# Patient Record
Sex: Male | Born: 1950
Health system: Southern US, Community
[De-identification: ages and names within clinical notes are randomized; demographics above are authoritative.]

## PROBLEM LIST (undated history)

## (undated) DIAGNOSIS — M25569 Pain in unspecified knee: Secondary | ICD-10-CM

## (undated) DIAGNOSIS — I10 Essential (primary) hypertension: Secondary | ICD-10-CM

## (undated) DIAGNOSIS — F329 Major depressive disorder, single episode, unspecified: Secondary | ICD-10-CM

## (undated) HISTORY — DX: Essential (primary) hypertension: I10

## (undated) HISTORY — DX: Pain in unspecified knee: M25.569

## (undated) HISTORY — DX: Major depressive disorder, single episode, unspecified: F32.9

## (undated) HISTORY — PX: HERNIA REPAIR: SHX51

---

## 1999-04-26 HISTORY — PX: COLONOSCOPY: SHX174

## 2000-04-25 HISTORY — PX: LASIK: SHX215

## 2000-04-25 HISTORY — PX: KIDNEY STONE SURGERY: SHX686

## 2001-07-30 ENCOUNTER — Inpatient Hospital Stay (HOSPITAL_COMMUNITY): Admission: AD | Admit: 2001-07-30 | Discharge: 2001-08-02 | Payer: Self-pay | Admitting: Cardiology

## 2005-04-25 HISTORY — PX: LITHOTRIPSY: SUR834

## 2007-04-26 HISTORY — PX: COLONOSCOPY: SHX174

## 2011-04-05 ENCOUNTER — Telehealth: Payer: Self-pay | Admitting: Internal Medicine

## 2012-04-25 HISTORY — PX: COLONOSCOPY: SHX5424

## 2012-07-26 DIAGNOSIS — M25569 Pain in unspecified knee: Secondary | ICD-10-CM | POA: Insufficient documentation

## 2012-07-26 HISTORY — DX: Pain in unspecified knee: M25.569

## 2015-04-29 DIAGNOSIS — J00 Acute nasopharyngitis [common cold]: Secondary | ICD-10-CM | POA: Diagnosis not present

## 2015-05-22 DIAGNOSIS — J40 Bronchitis, not specified as acute or chronic: Secondary | ICD-10-CM | POA: Diagnosis not present

## 2015-05-22 DIAGNOSIS — Z9181 History of falling: Secondary | ICD-10-CM | POA: Diagnosis not present

## 2015-09-08 DIAGNOSIS — J301 Allergic rhinitis due to pollen: Secondary | ICD-10-CM | POA: Diagnosis not present

## 2015-09-08 DIAGNOSIS — Z79899 Other long term (current) drug therapy: Secondary | ICD-10-CM | POA: Diagnosis not present

## 2015-09-08 DIAGNOSIS — I10 Essential (primary) hypertension: Secondary | ICD-10-CM | POA: Diagnosis not present

## 2015-09-08 DIAGNOSIS — Z8659 Personal history of other mental and behavioral disorders: Secondary | ICD-10-CM | POA: Diagnosis not present

## 2015-09-08 DIAGNOSIS — E1165 Type 2 diabetes mellitus with hyperglycemia: Secondary | ICD-10-CM | POA: Diagnosis not present

## 2015-09-08 DIAGNOSIS — Z125 Encounter for screening for malignant neoplasm of prostate: Secondary | ICD-10-CM | POA: Diagnosis not present

## 2015-09-08 DIAGNOSIS — I77810 Thoracic aortic ectasia: Secondary | ICD-10-CM | POA: Diagnosis not present

## 2015-09-08 DIAGNOSIS — Z23 Encounter for immunization: Secondary | ICD-10-CM | POA: Diagnosis not present

## 2015-09-08 DIAGNOSIS — Z1211 Encounter for screening for malignant neoplasm of colon: Secondary | ICD-10-CM | POA: Diagnosis not present

## 2015-09-08 DIAGNOSIS — Z8679 Personal history of other diseases of the circulatory system: Secondary | ICD-10-CM | POA: Diagnosis not present

## 2015-09-08 DIAGNOSIS — E782 Mixed hyperlipidemia: Secondary | ICD-10-CM | POA: Diagnosis not present

## 2015-09-14 DIAGNOSIS — Z1211 Encounter for screening for malignant neoplasm of colon: Secondary | ICD-10-CM | POA: Diagnosis not present

## 2015-10-26 DIAGNOSIS — H2513 Age-related nuclear cataract, bilateral: Secondary | ICD-10-CM | POA: Diagnosis not present

## 2015-12-22 DIAGNOSIS — L578 Other skin changes due to chronic exposure to nonionizing radiation: Secondary | ICD-10-CM | POA: Diagnosis not present

## 2015-12-22 DIAGNOSIS — L301 Dyshidrosis [pompholyx]: Secondary | ICD-10-CM | POA: Diagnosis not present

## 2016-07-19 DIAGNOSIS — B351 Tinea unguium: Secondary | ICD-10-CM | POA: Diagnosis not present

## 2016-07-19 DIAGNOSIS — D2239 Melanocytic nevi of other parts of face: Secondary | ICD-10-CM | POA: Diagnosis not present

## 2016-07-19 DIAGNOSIS — L570 Actinic keratosis: Secondary | ICD-10-CM | POA: Diagnosis not present

## 2016-09-26 DIAGNOSIS — Z136 Encounter for screening for cardiovascular disorders: Secondary | ICD-10-CM | POA: Diagnosis not present

## 2016-09-26 DIAGNOSIS — Z131 Encounter for screening for diabetes mellitus: Secondary | ICD-10-CM | POA: Diagnosis not present

## 2016-09-26 DIAGNOSIS — D519 Vitamin B12 deficiency anemia, unspecified: Secondary | ICD-10-CM | POA: Diagnosis not present

## 2016-09-26 DIAGNOSIS — Z0000 Encounter for general adult medical examination without abnormal findings: Secondary | ICD-10-CM | POA: Diagnosis not present

## 2016-09-26 DIAGNOSIS — Z79899 Other long term (current) drug therapy: Secondary | ICD-10-CM | POA: Diagnosis not present

## 2016-09-26 DIAGNOSIS — Z23 Encounter for immunization: Secondary | ICD-10-CM | POA: Diagnosis not present

## 2016-09-26 DIAGNOSIS — Z125 Encounter for screening for malignant neoplasm of prostate: Secondary | ICD-10-CM | POA: Diagnosis not present

## 2016-09-26 DIAGNOSIS — Z6831 Body mass index (BMI) 31.0-31.9, adult: Secondary | ICD-10-CM | POA: Diagnosis not present

## 2016-09-26 DIAGNOSIS — E782 Mixed hyperlipidemia: Secondary | ICD-10-CM | POA: Diagnosis not present

## 2016-09-26 DIAGNOSIS — Z9181 History of falling: Secondary | ICD-10-CM | POA: Diagnosis not present

## 2016-09-26 DIAGNOSIS — Z1389 Encounter for screening for other disorder: Secondary | ICD-10-CM | POA: Diagnosis not present

## 2016-10-04 DIAGNOSIS — I77811 Abdominal aortic ectasia: Secondary | ICD-10-CM | POA: Diagnosis not present

## 2016-10-04 DIAGNOSIS — Z136 Encounter for screening for cardiovascular disorders: Secondary | ICD-10-CM | POA: Diagnosis not present

## 2016-10-04 DIAGNOSIS — Z87891 Personal history of nicotine dependence: Secondary | ICD-10-CM | POA: Diagnosis not present

## 2016-11-21 DIAGNOSIS — H25813 Combined forms of age-related cataract, bilateral: Secondary | ICD-10-CM | POA: Diagnosis not present

## 2016-12-08 DIAGNOSIS — B351 Tinea unguium: Secondary | ICD-10-CM | POA: Diagnosis not present

## 2016-12-08 DIAGNOSIS — L304 Erythema intertrigo: Secondary | ICD-10-CM | POA: Diagnosis not present

## 2017-02-21 DIAGNOSIS — I10 Essential (primary) hypertension: Secondary | ICD-10-CM | POA: Diagnosis not present

## 2017-02-21 DIAGNOSIS — K420 Umbilical hernia with obstruction, without gangrene: Secondary | ICD-10-CM | POA: Diagnosis not present

## 2017-03-20 DIAGNOSIS — R7309 Other abnormal glucose: Secondary | ICD-10-CM | POA: Diagnosis not present

## 2017-03-20 DIAGNOSIS — Z79899 Other long term (current) drug therapy: Secondary | ICD-10-CM | POA: Diagnosis not present

## 2017-03-20 DIAGNOSIS — E782 Mixed hyperlipidemia: Secondary | ICD-10-CM | POA: Diagnosis not present

## 2017-03-28 DIAGNOSIS — Z1339 Encounter for screening examination for other mental health and behavioral disorders: Secondary | ICD-10-CM | POA: Diagnosis not present

## 2017-03-28 DIAGNOSIS — E7439 Other disorders of intestinal carbohydrate absorption: Secondary | ICD-10-CM | POA: Diagnosis not present

## 2017-03-28 DIAGNOSIS — I10 Essential (primary) hypertension: Secondary | ICD-10-CM | POA: Diagnosis not present

## 2017-03-28 DIAGNOSIS — E782 Mixed hyperlipidemia: Secondary | ICD-10-CM | POA: Diagnosis not present

## 2017-03-28 DIAGNOSIS — Z6829 Body mass index (BMI) 29.0-29.9, adult: Secondary | ICD-10-CM | POA: Diagnosis not present

## 2017-04-25 HISTORY — PX: HERNIA REPAIR: SHX51

## 2017-04-25 HISTORY — PX: COLONOSCOPY: SHX174

## 2017-05-09 DIAGNOSIS — K420 Umbilical hernia with obstruction, without gangrene: Secondary | ICD-10-CM | POA: Diagnosis not present

## 2017-05-09 DIAGNOSIS — I10 Essential (primary) hypertension: Secondary | ICD-10-CM | POA: Diagnosis not present

## 2017-05-18 DIAGNOSIS — I10 Essential (primary) hypertension: Secondary | ICD-10-CM | POA: Diagnosis not present

## 2017-05-18 DIAGNOSIS — K420 Umbilical hernia with obstruction, without gangrene: Secondary | ICD-10-CM | POA: Diagnosis not present

## 2017-05-18 DIAGNOSIS — Z01818 Encounter for other preprocedural examination: Secondary | ICD-10-CM | POA: Diagnosis not present

## 2017-05-18 DIAGNOSIS — I493 Ventricular premature depolarization: Secondary | ICD-10-CM | POA: Diagnosis not present

## 2017-05-18 DIAGNOSIS — I440 Atrioventricular block, first degree: Secondary | ICD-10-CM | POA: Diagnosis not present

## 2017-05-22 DIAGNOSIS — I10 Essential (primary) hypertension: Secondary | ICD-10-CM | POA: Diagnosis not present

## 2017-05-22 DIAGNOSIS — Z7982 Long term (current) use of aspirin: Secondary | ICD-10-CM | POA: Diagnosis not present

## 2017-05-22 DIAGNOSIS — Z87891 Personal history of nicotine dependence: Secondary | ICD-10-CM | POA: Diagnosis not present

## 2017-05-22 DIAGNOSIS — Z79899 Other long term (current) drug therapy: Secondary | ICD-10-CM | POA: Diagnosis not present

## 2017-05-22 DIAGNOSIS — K420 Umbilical hernia with obstruction, without gangrene: Secondary | ICD-10-CM | POA: Diagnosis not present

## 2017-05-22 DIAGNOSIS — I4891 Unspecified atrial fibrillation: Secondary | ICD-10-CM | POA: Diagnosis not present

## 2017-10-10 DIAGNOSIS — B351 Tinea unguium: Secondary | ICD-10-CM | POA: Diagnosis not present

## 2017-10-10 DIAGNOSIS — L578 Other skin changes due to chronic exposure to nonionizing radiation: Secondary | ICD-10-CM | POA: Diagnosis not present

## 2017-10-10 DIAGNOSIS — L821 Other seborrheic keratosis: Secondary | ICD-10-CM | POA: Diagnosis not present

## 2017-10-10 DIAGNOSIS — L570 Actinic keratosis: Secondary | ICD-10-CM | POA: Diagnosis not present

## 2017-10-13 DIAGNOSIS — E7439 Other disorders of intestinal carbohydrate absorption: Secondary | ICD-10-CM | POA: Diagnosis not present

## 2017-10-13 DIAGNOSIS — D510 Vitamin B12 deficiency anemia due to intrinsic factor deficiency: Secondary | ICD-10-CM | POA: Diagnosis not present

## 2017-10-13 DIAGNOSIS — Z79899 Other long term (current) drug therapy: Secondary | ICD-10-CM | POA: Diagnosis not present

## 2017-10-13 DIAGNOSIS — Z8679 Personal history of other diseases of the circulatory system: Secondary | ICD-10-CM | POA: Diagnosis not present

## 2017-10-13 DIAGNOSIS — I77810 Thoracic aortic ectasia: Secondary | ICD-10-CM | POA: Diagnosis not present

## 2017-10-13 DIAGNOSIS — E782 Mixed hyperlipidemia: Secondary | ICD-10-CM | POA: Diagnosis not present

## 2017-10-13 DIAGNOSIS — Z6830 Body mass index (BMI) 30.0-30.9, adult: Secondary | ICD-10-CM | POA: Diagnosis not present

## 2017-10-13 DIAGNOSIS — I10 Essential (primary) hypertension: Secondary | ICD-10-CM | POA: Diagnosis not present

## 2017-10-13 DIAGNOSIS — Z125 Encounter for screening for malignant neoplasm of prostate: Secondary | ICD-10-CM | POA: Diagnosis not present

## 2017-10-13 DIAGNOSIS — Z0000 Encounter for general adult medical examination without abnormal findings: Secondary | ICD-10-CM | POA: Diagnosis not present

## 2017-10-19 DIAGNOSIS — K5730 Diverticulosis of large intestine without perforation or abscess without bleeding: Secondary | ICD-10-CM | POA: Diagnosis not present

## 2017-10-19 DIAGNOSIS — K648 Other hemorrhoids: Secondary | ICD-10-CM | POA: Diagnosis not present

## 2017-10-31 DIAGNOSIS — B309 Viral conjunctivitis, unspecified: Secondary | ICD-10-CM | POA: Diagnosis not present

## 2017-11-07 DIAGNOSIS — K648 Other hemorrhoids: Secondary | ICD-10-CM | POA: Diagnosis not present

## 2017-11-07 DIAGNOSIS — F329 Major depressive disorder, single episode, unspecified: Secondary | ICD-10-CM | POA: Diagnosis not present

## 2017-11-07 DIAGNOSIS — E7800 Pure hypercholesterolemia, unspecified: Secondary | ICD-10-CM | POA: Diagnosis not present

## 2017-11-07 DIAGNOSIS — Z7982 Long term (current) use of aspirin: Secondary | ICD-10-CM | POA: Diagnosis not present

## 2017-11-07 DIAGNOSIS — K5730 Diverticulosis of large intestine without perforation or abscess without bleeding: Secondary | ICD-10-CM | POA: Diagnosis not present

## 2017-11-07 DIAGNOSIS — Z79899 Other long term (current) drug therapy: Secondary | ICD-10-CM | POA: Diagnosis not present

## 2017-11-07 DIAGNOSIS — K644 Residual hemorrhoidal skin tags: Secondary | ICD-10-CM | POA: Diagnosis not present

## 2017-11-07 DIAGNOSIS — Z09 Encounter for follow-up examination after completed treatment for conditions other than malignant neoplasm: Secondary | ICD-10-CM | POA: Diagnosis not present

## 2017-11-07 DIAGNOSIS — I10 Essential (primary) hypertension: Secondary | ICD-10-CM | POA: Diagnosis not present

## 2017-11-07 DIAGNOSIS — Z86010 Personal history of colonic polyps: Secondary | ICD-10-CM | POA: Diagnosis not present

## 2017-11-07 DIAGNOSIS — Z1211 Encounter for screening for malignant neoplasm of colon: Secondary | ICD-10-CM | POA: Diagnosis not present

## 2017-11-07 DIAGNOSIS — Z87891 Personal history of nicotine dependence: Secondary | ICD-10-CM | POA: Diagnosis not present

## 2017-11-21 ENCOUNTER — Encounter: Payer: Self-pay | Admitting: Cardiology

## 2017-11-21 ENCOUNTER — Encounter (INDEPENDENT_AMBULATORY_CARE_PROVIDER_SITE_OTHER): Payer: Self-pay

## 2017-11-21 ENCOUNTER — Ambulatory Visit (INDEPENDENT_AMBULATORY_CARE_PROVIDER_SITE_OTHER): Payer: PPO | Admitting: Cardiology

## 2017-11-21 VITALS — BP 132/68 | HR 64 | Ht 74.0 in | Wt 235.0 lb

## 2017-11-21 DIAGNOSIS — I471 Supraventricular tachycardia, unspecified: Secondary | ICD-10-CM

## 2017-11-21 DIAGNOSIS — E785 Hyperlipidemia, unspecified: Secondary | ICD-10-CM

## 2017-11-21 DIAGNOSIS — F32A Depression, unspecified: Secondary | ICD-10-CM

## 2017-11-21 DIAGNOSIS — F329 Major depressive disorder, single episode, unspecified: Secondary | ICD-10-CM | POA: Insufficient documentation

## 2017-11-21 DIAGNOSIS — I10 Essential (primary) hypertension: Secondary | ICD-10-CM | POA: Diagnosis not present

## 2017-11-21 HISTORY — DX: Supraventricular tachycardia: I47.1

## 2017-11-21 HISTORY — DX: Hyperlipidemia, unspecified: E78.5

## 2017-11-21 HISTORY — DX: Essential (primary) hypertension: I10

## 2017-11-21 HISTORY — DX: Depression, unspecified: F32.A

## 2017-11-21 HISTORY — DX: Supraventricular tachycardia, unspecified: I47.10

## 2017-11-21 NOTE — Patient Instructions (Signed)
Medication Instructions:  Your physician recommends that you continue on your current medications as directed. Please refer to the Current Medication list given to you today.  Labwork: None  Testing/Procedures: Your physician has requested that you have an echocardiogram. Echocardiography is a painless test that uses sound waves to create images of your heart. It provides your doctor with information about the size and shape of your heart and how well your heart's chambers and valves are working. This procedure takes approximately one hour. There are no restrictions for this procedure.  Your physician has recommended that you wear a holter monitor. Holter monitors are medical devices that record the heart's electrical activity. Doctors most often use these monitors to diagnose arrhythmias. Arrhythmias are problems with the speed or rhythm of the heartbeat. The monitor is a small, portable device. You can wear one while you do your normal daily activities. This is usually used to diagnose what is causing palpitations/syncope (passing out).  Follow-Up: Your physician recommends that you schedule a follow-up appointment in: 1 month  Any Other Special Instructions Will Be Listed Below (If Applicable).     If you need a refill on your cardiac medications before your next appointment, please call your pharmacy.   [place], RN, BSN

## 2017-11-21 NOTE — Progress Notes (Signed)
Cardiology Consultation:    Date:  11/21/2017   ID:  DENNY MCCREE, DOB [birth year], MRN [id number]  PCP:  Angelina Sheriff, MD  Cardiologist:  Jenne Campus, MD   Referring MD: Angelina Sheriff, MD   No chief complaint on file. I would like to be established as a patient  History of Present Illness:    Austin Drake is a 67 y.o. male who is being seen today for the evaluation of history of supraventricular tachycardia ablation in 2003 he had some palpitations in the up going to the point that at the request of [place], John F. II, MD.  And eventually he was sent to Dr. Dorothea Ogle and he ended up having an ablation done I suspect this is supraventricular tachycardia ablation however I do not have documentation of it.  He did have palpitations before that but since the time of ablation there is no trouble he can exercise he try to exercise on the regular basis he does it 3 times a week denies having any chest pain tightness squeezing pressure branches but described to have shortness of breath.  There is no palpitations no dizziness no passing out  Past Medical History:  Diagnosis Date  . Depression 11/21/2017  . Hypertension 11/21/2017  . Knee joint pain 07/26/2012    Past Surgical History:  Procedure Laterality Date  . COLONOSCOPY  2001  . COLONOSCOPY  2009  . COLONOSCOPY  2014  . COLONOSCOPY  2019  . HERNIA REPAIR    . HERNIA REPAIR  2019  . KIDNEY STONE SURGERY  2002  . LASIK  2002  . LITHOTRIPSY  2007    Current Medications: Current Meds  Medication Sig  . aspirin EC 81 MG tablet Take 1 tablet by mouth daily.  . benazepril (LOTENSIN) 5 MG tablet Take 1 tablet by mouth daily.  Marland Kitchen buPROPion (WELLBUTRIN SR) 150 MG 12 hr tablet Take 1 tablet by mouth daily.  . Cyanocobalamin (B-12 TR) 1000 MCG TBCR Take 1 tablet by mouth 2 (two) times daily.      Allergies:   Patient has no known allergies.   Social History   Socioeconomic History  . Marital status: Married      Spouse name: Not on file  . Number of children: Not on file  . Years of education: Not on file  . Highest education level: Not on file  Occupational History  . Not on file  Social Needs  . Financial resource strain: Not on file  . Food insecurity:    Worry: Not on file    Inability: Not on file  . Transportation needs:    Medical: Not on file    Non-medical: Not on file  Tobacco Use  . Smoking status: Former Research scientist (life sciences)  . Smokeless tobacco: Never Used  Substance and Sexual Activity  . Alcohol use: Not on file  . Drug use: Not on file  . Sexual activity: Not on file  Lifestyle  . Physical activity:    Days per week: Not on file    Minutes per session: Not on file  . Stress: Not on file  Relationships  . Social connections:    Talks on phone: Not on file    Gets together: Not on file    Attends religious service: Not on file    Active member of club or organization: Not on file    Attends meetings of clubs or organizations: Not on file    Relationship  status: Not on file  Other Topics Concern  . Not on file  Social History Narrative  . Not on file     Family History: The patient's family history includes Diabetes in his father; Heart disease in his father. ROS:   Please see the history of present illness.    All 14 point review of systems negative except as described per history of present illness.  EKGs/Labs/Other Studies Reviewed:    The following studies were reviewed today:   EKG:  EKG is  ordered today.  The ekg ordered today demonstrates normal sinus rhythm first-degree AV block normal QS complex duration morphology left axis no ST-T segment changes  Recent Labs: No results found for requested labs within last 8760 hours.  Recent Lipid Panel No results found for: CHOL, TRIG, HDL, CHOLHDL, VLDL, LDLCALC, LDLDIRECT  Physical Exam:    VS:  BP 132/68 (BP Location: Right Arm, Patient Position: Sitting, Cuff Size: Normal)   Pulse 64   Ht 6\' 2"  (1.88 m)    Wt 235 lb (106.6 kg)   SpO2 98%   BMI 30.17 kg/m     Wt Readings from Last 3 Encounters:  11/21/17 235 lb (106.6 kg)     GEN:  Well nourished, well developed in no acute distress HEENT: Normal NECK: No JVD; No carotid bruits LYMPHATICS: No lymphadenopathy CARDIAC: RRR, no murmurs, no rubs, no gallops RESPIRATORY:  Clear to auscultation without rales, wheezing or rhonchi  ABDOMEN: Soft, non-tender, non-distended MUSCULOSKELETAL:  No edema; No deformity  SKIN: Warm and dry NEUROLOGIC:  Alert and oriented x 3 PSYCHIATRIC:  Normal affect   ASSESSMENT:    1. Essential hypertension   2. SVT (supraventricular tachycardia) ([place])   3. Dyslipidemia    PLAN:    In order of problems listed above:  1. History of supraventricular tachycardia ablation we will try to get record to clarify exactly what was done.  He denies having any palpitations however he does have first-degree AV block on EKG therefore I will schedule him to have 48 hours Holter monitor.  As a part of evaluation we will get echocardiogram. 2. Essential hypertension blood pressure appears to be well controlled continue present management. 3. Dyslipidemia he is taking fluvastatin which I will continue we will call primary care physician to get his fasting lipid profile.  See him back in my office in about 1 month or sooner if he get a problem   Medication Adjustments/Labs and Tests Ordered: Current medicines are reviewed at length with the patient today.  Concerns regarding medicines are outlined above.  No orders of the defined types were placed in this encounter.  No orders of the defined types were placed in this encounter.   Signed, Park Liter, MD, [facility]. 11/21/2017 3:14 PM    [facility]

## 2017-11-27 DIAGNOSIS — H25813 Combined forms of age-related cataract, bilateral: Secondary | ICD-10-CM | POA: Diagnosis not present

## 2017-12-04 ENCOUNTER — Ambulatory Visit (INDEPENDENT_AMBULATORY_CARE_PROVIDER_SITE_OTHER): Payer: PPO

## 2017-12-04 DIAGNOSIS — I471 Supraventricular tachycardia: Secondary | ICD-10-CM | POA: Diagnosis not present

## 2017-12-18 ENCOUNTER — Ambulatory Visit: Payer: PPO | Admitting: Cardiology

## 2017-12-28 ENCOUNTER — Ambulatory Visit (INDEPENDENT_AMBULATORY_CARE_PROVIDER_SITE_OTHER): Payer: PPO

## 2017-12-28 ENCOUNTER — Other Ambulatory Visit: Payer: Self-pay

## 2017-12-28 DIAGNOSIS — I471 Supraventricular tachycardia: Secondary | ICD-10-CM

## 2017-12-28 NOTE — Progress Notes (Signed)
Complete echocardiogram has been performed.  Jimmy Georgio Hattabaugh RDCS 

## 2018-01-03 ENCOUNTER — Encounter: Payer: Self-pay | Admitting: Cardiology

## 2018-01-03 ENCOUNTER — Ambulatory Visit (INDEPENDENT_AMBULATORY_CARE_PROVIDER_SITE_OTHER): Payer: PPO | Admitting: Cardiology

## 2018-01-03 VITALS — BP 120/64 | HR 68 | Ht 74.0 in | Wt 233.2 lb

## 2018-01-03 DIAGNOSIS — I10 Essential (primary) hypertension: Secondary | ICD-10-CM | POA: Diagnosis not present

## 2018-01-03 DIAGNOSIS — E785 Hyperlipidemia, unspecified: Secondary | ICD-10-CM | POA: Diagnosis not present

## 2018-01-03 DIAGNOSIS — I471 Supraventricular tachycardia: Secondary | ICD-10-CM | POA: Diagnosis not present

## 2018-01-03 DIAGNOSIS — I4729 Other ventricular tachycardia: Secondary | ICD-10-CM | POA: Insufficient documentation

## 2018-01-03 DIAGNOSIS — I472 Ventricular tachycardia: Secondary | ICD-10-CM | POA: Diagnosis not present

## 2018-01-03 HISTORY — DX: Ventricular tachycardia: I47.2

## 2018-01-03 HISTORY — DX: Other ventricular tachycardia: I47.29

## 2018-01-03 NOTE — Patient Instructions (Signed)

## 2018-01-03 NOTE — Progress Notes (Signed)
Cardiology Office Note:    Date:  01/03/2018   ID:  Austin Drake, DOB 1950/06/18, MRN [id number]  PCP:  Angelina Sheriff, MD  Cardiologist:  Jenne Campus, MD    Referring MD: Angelina Sheriff, MD   Chief Complaint  Patient presents with  . 1 month follow up  Doing well no chest pain tightness squeezing pressure burning chest no palpitations no dizziness no passing out.  History of Present Illness:    Austin Drake is a 67 y.o. male with history of supraventricular tachycardia ablation comes today to my office for follow-up doing well no chest pain tightness squeezing pressure burning chest.  He did have echocardiogram which showed preserved left ventricular ejection fraction.  He also wore Holter monitor for 48 hours which showed a lot of supraventricular as well as ventricle ectopy surprisingly there were episodes of nonsustained ventricular tachycardia which is completely asymptomatic.  Since he is doing well from that point review we will continue conservative  Past Medical History:  Diagnosis Date  . Depression 11/21/2017  . Hypertension 11/21/2017  . Knee joint pain 07/26/2012    Past Surgical History:  Procedure Laterality Date  . COLONOSCOPY  2001  . COLONOSCOPY  2009  . COLONOSCOPY  2014  . COLONOSCOPY  2019  . HERNIA REPAIR    . HERNIA REPAIR  2019  . KIDNEY STONE SURGERY  2002  . LASIK  2002  . LITHOTRIPSY  2007    Current Medications: Current Meds  Medication Sig  . aspirin EC 81 MG tablet Take 1 tablet by mouth daily.  . benazepril (LOTENSIN) 5 MG tablet Take 1 tablet by mouth daily.  Marland Kitchen buPROPion (WELLBUTRIN SR) 150 MG 12 hr tablet Take 1 tablet by mouth daily.  . Cyanocobalamin (B-12 TR) 1000 MCG TBCR Take 1 tablet by mouth 2 (two) times daily.   . fluvastatin XL (LESCOL XL) 80 MG 24 hr tablet Take 80 mg by mouth daily.     Allergies:   Patient has no known allergies.   Social History   Socioeconomic History  . Marital status: Married      Spouse name: Not on file  . Number of children: Not on file  . Years of education: Not on file  . Highest education level: Not on file  Occupational History  . Not on file  Social Needs  . Financial resource strain: Not on file  . Food insecurity:    Worry: Not on file    Inability: Not on file  . Transportation needs:    Medical: Not on file    Non-medical: Not on file  Tobacco Use  . Smoking status: Former Research scientist (life sciences)  . Smokeless tobacco: Never Used  Substance and Sexual Activity  . Alcohol use: Not on file  . Drug use: Not on file  . Sexual activity: Not on file  Lifestyle  . Physical activity:    Days per week: Not on file    Minutes per session: Not on file  . Stress: Not on file  Relationships  . Social connections:    Talks on phone: Not on file    Gets together: Not on file    Attends religious service: Not on file    Active member of club or organization: Not on file    Attends meetings of clubs or organizations: Not on file    Relationship status: Not on file  Other Topics Concern  . Not on file  Social  History Narrative  . Not on file     Family History: The patient's family history includes Diabetes in his father; Heart disease in his father. ROS:   Please see the history of present illness.    All 14 point review of systems negative except as described per history of present illness  EKGs/Labs/Other Studies Reviewed:      Recent Labs: No results found for requested labs within last 8760 hours.  Recent Lipid Panel No results found for: CHOL, TRIG, HDL, CHOLHDL, VLDL, LDLCALC, LDLDIRECT  Physical Exam:    VS:  BP 120/64   Pulse 68   Ht 6\' 2"  (1.88 m)   Wt 233 lb 3.2 oz (105.8 kg)   SpO2 94%   BMI 29.94 kg/m     Wt Readings from Last 3 Encounters:  01/03/18 233 lb 3.2 oz (105.8 kg)  11/21/17 235 lb (106.6 kg)     GEN:  Well nourished, well developed in no acute distress HEENT: Normal NECK: No JVD; No carotid bruits LYMPHATICS: No  lymphadenopathy CARDIAC: RRR, no murmurs, no rubs, no gallops RESPIRATORY:  Clear to auscultation without rales, wheezing or rhonchi  ABDOMEN: Soft, non-tender, non-distended MUSCULOSKELETAL:  No edema; No deformity  SKIN: Warm and dry LOWER EXTREMITIES: no swelling NEUROLOGIC:  Alert and oriented x 3 PSYCHIATRIC:  Normal affect   ASSESSMENT:    1. SVT (supraventricular tachycardia) ([place])   2. Essential hypertension   3. Dyslipidemia   4. Nonsustained ventricular tachycardia (HCC)    PLAN:    In order of problems listed above:  1. Supraventricular tachycardia stable on appropriate medications which I will continue. 2. Nonsustained ventricular tachycardia asymptomatic preserved left ventricular ejection fraction aggressively exercising with no difficulties we will continue present management. 3. Dyslipidemia continue with Lescol.  Overall he is doing well I asked him to let me know if he still having some unexplained shortness of breath dizziness or passing out.  Otherwise I see him back in 6 months   Medication Adjustments/Labs and Tests Ordered: Current medicines are reviewed at length with the patient today.  Concerns regarding medicines are outlined above.  No orders of the defined types were placed in this encounter.  Medication changes: No orders of the defined types were placed in this encounter.   Signed, Park Liter, MD, [facility] 01/03/2018 4:10 PM     [facility]

## 2018-04-03 DIAGNOSIS — L570 Actinic keratosis: Secondary | ICD-10-CM | POA: Diagnosis not present

## 2018-07-02 ENCOUNTER — Encounter: Payer: Self-pay | Admitting: Cardiology

## 2018-07-02 ENCOUNTER — Ambulatory Visit (INDEPENDENT_AMBULATORY_CARE_PROVIDER_SITE_OTHER): Payer: PPO | Admitting: Cardiology

## 2018-07-02 VITALS — BP 122/64 | HR 74 | Ht 74.0 in | Wt 217.4 lb

## 2018-07-02 DIAGNOSIS — I471 Supraventricular tachycardia, unspecified: Secondary | ICD-10-CM

## 2018-07-02 DIAGNOSIS — I10 Essential (primary) hypertension: Secondary | ICD-10-CM | POA: Diagnosis not present

## 2018-07-02 DIAGNOSIS — E785 Hyperlipidemia, unspecified: Secondary | ICD-10-CM

## 2018-07-02 DIAGNOSIS — I4729 Other ventricular tachycardia: Secondary | ICD-10-CM

## 2018-07-02 DIAGNOSIS — I472 Ventricular tachycardia: Secondary | ICD-10-CM | POA: Diagnosis not present

## 2018-07-02 NOTE — Addendum Note (Signed)
Addended by: Ashok Norris on: 07/02/2018 08:58 AM   Modules accepted: Orders

## 2018-07-02 NOTE — Progress Notes (Signed)
Cardiology Office Note:    Date:  07/02/2018   ID:  Austin Drake, DOB 07/24/1950, MRN [id number]  PCP:  Angelina Sheriff, MD  Cardiologist:  Jenne Campus, MD    Referring MD: Angelina Sheriff, MD   Chief Complaint  Patient presents with  . Follow-up  Doing great  History of Present Illness:    Austin Drake is a 68 y.o. male. Supraventricular tachycardia status post supraventricular tachycardia ablation done close to 18 years ago.  I have been following him for frequent ventricular ectopy with nonsustained ventricular tachycardia overall he is doing great he exercised on the regular basis aggressively with no difficulties I did echocardiogram in September which showed preserved normal left ventricular ejection fraction interestingly at the end of the visit today he mentioned to 1 situation that he became momentarily dizzy when he was driving a car.  He did not passed out he never had any episode of syncope he does get slight dizziness he gets up very quickly and this is the only episode that he got there is somewhat concerning for arrhythmia he did not have any palpitations when he was getting dizzy.  Past Medical History:  Diagnosis Date  . Depression 11/21/2017  . Hypertension 11/21/2017  . Knee joint pain 07/26/2012    Past Surgical History:  Procedure Laterality Date  . COLONOSCOPY  2001  . COLONOSCOPY  2009  . COLONOSCOPY  2014  . COLONOSCOPY  2019  . HERNIA REPAIR    . HERNIA REPAIR  2019  . KIDNEY STONE SURGERY  2002  . LASIK  2002  . LITHOTRIPSY  2007    Current Medications: Current Meds  Medication Sig  . aspirin EC 81 MG tablet Take 1 tablet by mouth daily.  . benazepril (LOTENSIN) 5 MG tablet Take 1 tablet by mouth daily.  Marland Kitchen buPROPion (WELLBUTRIN SR) 150 MG 12 hr tablet Take 1 tablet by mouth daily.  . Cyanocobalamin (B-12 TR) 1000 MCG TBCR Take 1 tablet by mouth 2 (two) times daily.   . fluvastatin XL (LESCOL XL) 80 MG 24 hr tablet Take 80 mg by  mouth daily.     Allergies:   Patient has no known allergies.   Social History   Socioeconomic History  . Marital status: Married    Spouse name: Not on file  . Number of children: Not on file  . Years of education: Not on file  . Highest education level: Not on file  Occupational History  . Not on file  Social Needs  . Financial resource strain: Not on file  . Food insecurity:    Worry: Not on file    Inability: Not on file  . Transportation needs:    Medical: Not on file    Non-medical: Not on file  Tobacco Use  . Smoking status: Former Research scientist (life sciences)  . Smokeless tobacco: Never Used  Substance and Sexual Activity  . Alcohol use: Not on file  . Drug use: Not on file  . Sexual activity: Not on file  Lifestyle  . Physical activity:    Days per week: Not on file    Minutes per session: Not on file  . Stress: Not on file  Relationships  . Social connections:    Talks on phone: Not on file    Gets together: Not on file    Attends religious service: Not on file    Active member of club or organization: Not on file  Attends meetings of clubs or organizations: Not on file    Relationship status: Not on file  Other Topics Concern  . Not on file  Social History Narrative  . Not on file     Family History: The patient's family history includes Diabetes in his father; Heart disease in his father. ROS:   Please see the history of present illness.    All 14 point review of systems negative except as described per history of present illness  EKGs/Labs/Other Studies Reviewed:      Recent Labs: No results found for requested labs within last 8760 hours.  Recent Lipid Panel No results found for: CHOL, TRIG, HDL, CHOLHDL, VLDL, LDLCALC, LDLDIRECT  Physical Exam:    VS:  BP 122/64   Pulse 74   Ht 6\' 2"  (1.88 m)   Wt 217 lb 6.4 oz (98.6 kg)   SpO2 98%   BMI 27.91 kg/m     Wt Readings from Last 3 Encounters:  07/02/18 217 lb 6.4 oz (98.6 kg)  01/03/18 233 lb 3.2 oz  (105.8 kg)  11/21/17 235 lb (106.6 kg)     GEN:  Well nourished, well developed in no acute distress HEENT: Normal NECK: No JVD; No carotid bruits LYMPHATICS: No lymphadenopathy CARDIAC: RRR, no murmurs, no rubs, no gallops RESPIRATORY:  Clear to auscultation without rales, wheezing or rhonchi  ABDOMEN: Soft, non-tender, non-distended MUSCULOSKELETAL:  No edema; No deformity  SKIN: Warm and dry LOWER EXTREMITIES: no swelling NEUROLOGIC:  Alert and oriented x 3 PSYCHIATRIC:  Normal affect   ASSESSMENT:    1. Nonsustained ventricular tachycardia (HCC)   2. SVT (supraventricular tachycardia) ([place])   3. Essential hypertension   4. Dyslipidemia    PLAN:    In order of problems listed above:  1. Sustained ventricular tachycardia at 1 episode of dizziness which have obviously is concerning I will ask him to wear monitor for 7 days to see how much ventricular ectopy he has.  He may require referral to EP team.  Luckily his ejection fraction was normal last echocardiogram.  I will not pursue ischemia work-up at the moment since he is able to aggressively exercise with absolutely no difficulties. 2. Supraventricular tachycardia.  That seems to be controlled.  Status post ablation. 3. Essential hypertension blood pressure well controlled continue present management. 4. Dyslipidemia last fasting lipid profile have is from summer was excellent.  He is scheduled to see his primary care physician in June he will have fasting lipid profile done then.  I asked him to let me know if he get episode of syncope or unexplained dizziness.   Medication Adjustments/Labs and Tests Ordered: Current medicines are reviewed at length with the patient today.  Concerns regarding medicines are outlined above.  No orders of the defined types were placed in this encounter.  Medication changes: No orders of the defined types were placed in this encounter.   Signed, Park Liter, MD, [facility] 07/02/2018 8:46  AM    [place]

## 2018-07-02 NOTE — Patient Instructions (Signed)
Medication Instructions:  Your physician recommends that you continue on your current medications as directed. Please refer to the Current Medication list given to you today.  If you need a refill on your cardiac medications before your next appointment, please call your pharmacy.   Lab work: None.  If you have labs (blood work) drawn today and your tests are completely normal, you will receive your results only by: Marland Kitchen MyChart Message (if you have MyChart) OR . A paper copy in the mail If you have any lab test that is abnormal or we need to change your treatment, we will call you to review the results.  Testing/Procedures: Your physician has requested that you have an echocardiogram. Echocardiography is a painless test that uses sound waves to create images of your heart. It provides your doctor with information about the size and shape of your heart and how well your heart's chambers and valves are working. This procedure takes approximately one hour. There are no restrictions for this procedure.  Your physician has recommended that you wear a holter monitor. Holter monitors are medical devices that record the heart's electrical activity. Doctors most often use these monitors to diagnose arrhythmias. Arrhythmias are problems with the speed or rhythm of the heartbeat. The monitor is a small, portable device. You can wear one while you do your normal daily activities. This is usually used to diagnose what is causing palpitations/syncope (passing out). Wear for 24 hours     Follow-Up: At [facility], you and your health needs are our priority.  As part of our continuing mission to provide you with exceptional heart care, we have created designated Provider Care Teams.  These Care Teams include your primary Cardiologist (physician) and Advanced Practice Providers (APPs -  Physician Assistants and Nurse Practitioners) who all work together to provide you with the care you need, when you need  it. You will need a follow up appointment in 6 months.  Please call our office 2 months in advance to schedule this appointment.  You may see No primary care provider on file. or another member of our [employer] Provider Team in [place]: Shirlee More, MD . Jyl Heinz, MD  Any Other Special Instructions Will Be Listed Below (If Applicable).   Echocardiogram An echocardiogram is a procedure that uses painless sound waves (ultrasound) to produce an image of the heart. Images from an echocardiogram can provide important information about:  Signs of coronary artery disease (CAD).  Aneurysm detection. An aneurysm is a weak or damaged part of an artery wall that bulges out from the normal force of blood pumping through the body.  Heart size and shape. Changes in the size or shape of the heart can be associated with certain conditions, including heart failure, aneurysm, and CAD.  Heart muscle function.  Heart valve function.  Signs of a past heart attack.  Fluid buildup around the heart.  Thickening of the heart muscle.  A tumor or infectious growth around the heart valves. Tell a health care provider about:  Any allergies you have.  All medicines you are taking, including vitamins, herbs, eye drops, creams, and over-the-counter medicines.  Any blood disorders you have.  Any surgeries you have had.  Any medical conditions you have.  Whether you are pregnant or may be pregnant. What are the risks? Generally, this is a safe procedure. However, problems may occur, including:  Allergic reaction to dye (contrast) that may be used during the procedure. What happens before the procedure?  No specific preparation is needed. You may eat and drink normally. What happens during the procedure?   An IV tube may be inserted into one of your veins.  You may receive contrast through this tube. A contrast is an injection that improves the quality of the pictures from your  heart.  A gel will be applied to your chest.  A wand-like tool (transducer) will be moved over your chest. The gel will help to transmit the sound waves from the transducer.  The sound waves will harmlessly bounce off of your heart to allow the heart images to be captured in real-time motion. The images will be recorded on a computer. The procedure may vary among health care providers and hospitals. What happens after the procedure?  You may return to your normal, everyday life, including diet, activities, and medicines, unless your health care provider tells you not to do that. Summary  An echocardiogram is a procedure that uses painless sound waves (ultrasound) to produce an image of the heart.  Images from an echocardiogram can provide important information about the size and shape of your heart, heart muscle function, heart valve function, and fluid buildup around your heart.  You do not need to do anything to prepare before this procedure. You may eat and drink normally.  After the echocardiogram is completed, you may return to your normal, everyday life, unless your health care provider tells you not to do that. This information is not intended to replace advice given to you by your health care provider. Make sure you discuss any questions you have with your health care provider. Document Released: 04/08/2000 Document Revised: 05/14/2016 Document Reviewed: 05/14/2016 Elsevier Interactive Patient Education  2019 [employer].

## 2018-07-24 ENCOUNTER — Telehealth: Payer: Self-pay | Admitting: *Deleted

## 2018-07-24 NOTE — Telephone Encounter (Signed)
Cancelled echo. Monitor to be mailed

## 2018-07-24 NOTE — Telephone Encounter (Signed)
Left message for pt to call us back. Need to cancel echo and wanted to see if it would be alright to have monitor mailed to pt for him to put on.

## 2018-07-26 DIAGNOSIS — R7989 Other specified abnormal findings of blood chemistry: Secondary | ICD-10-CM | POA: Diagnosis not present

## 2018-07-26 DIAGNOSIS — I471 Supraventricular tachycardia: Secondary | ICD-10-CM | POA: Diagnosis not present

## 2018-07-26 DIAGNOSIS — N261 Atrophy of kidney (terminal): Secondary | ICD-10-CM | POA: Diagnosis not present

## 2018-07-26 DIAGNOSIS — N281 Cyst of kidney, acquired: Secondary | ICD-10-CM | POA: Diagnosis not present

## 2018-07-30 ENCOUNTER — Other Ambulatory Visit (INDEPENDENT_AMBULATORY_CARE_PROVIDER_SITE_OTHER): Payer: PPO

## 2018-07-30 DIAGNOSIS — I471 Supraventricular tachycardia: Secondary | ICD-10-CM | POA: Diagnosis not present

## 2018-07-30 DIAGNOSIS — I10 Essential (primary) hypertension: Secondary | ICD-10-CM

## 2018-08-01 ENCOUNTER — Other Ambulatory Visit: Payer: PPO

## 2018-10-25 DIAGNOSIS — D519 Vitamin B12 deficiency anemia, unspecified: Secondary | ICD-10-CM | POA: Diagnosis not present

## 2018-10-25 DIAGNOSIS — Z125 Encounter for screening for malignant neoplasm of prostate: Secondary | ICD-10-CM | POA: Diagnosis not present

## 2018-10-25 DIAGNOSIS — Z0000 Encounter for general adult medical examination without abnormal findings: Secondary | ICD-10-CM | POA: Diagnosis not present

## 2018-10-25 DIAGNOSIS — D510 Vitamin B12 deficiency anemia due to intrinsic factor deficiency: Secondary | ICD-10-CM | POA: Diagnosis not present

## 2018-10-25 DIAGNOSIS — I77810 Thoracic aortic ectasia: Secondary | ICD-10-CM | POA: Diagnosis not present

## 2018-10-25 DIAGNOSIS — I10 Essential (primary) hypertension: Secondary | ICD-10-CM | POA: Diagnosis not present

## 2018-10-25 DIAGNOSIS — F329 Major depressive disorder, single episode, unspecified: Secondary | ICD-10-CM | POA: Diagnosis not present

## 2018-10-25 DIAGNOSIS — E782 Mixed hyperlipidemia: Secondary | ICD-10-CM | POA: Diagnosis not present

## 2018-10-25 DIAGNOSIS — J302 Other seasonal allergic rhinitis: Secondary | ICD-10-CM | POA: Diagnosis not present

## 2018-10-25 DIAGNOSIS — Z79899 Other long term (current) drug therapy: Secondary | ICD-10-CM | POA: Diagnosis not present

## 2018-10-25 DIAGNOSIS — Z6827 Body mass index (BMI) 27.0-27.9, adult: Secondary | ICD-10-CM | POA: Diagnosis not present

## 2018-12-05 DIAGNOSIS — H2513 Age-related nuclear cataract, bilateral: Secondary | ICD-10-CM | POA: Diagnosis not present

## 2019-01-02 ENCOUNTER — Encounter: Payer: Self-pay | Admitting: Cardiology

## 2019-01-02 ENCOUNTER — Ambulatory Visit (INDEPENDENT_AMBULATORY_CARE_PROVIDER_SITE_OTHER): Payer: PPO | Admitting: Cardiology

## 2019-01-02 ENCOUNTER — Other Ambulatory Visit: Payer: Self-pay

## 2019-01-02 VITALS — BP 134/72 | HR 74 | Ht 74.0 in | Wt 210.6 lb

## 2019-01-02 DIAGNOSIS — I4729 Other ventricular tachycardia: Secondary | ICD-10-CM

## 2019-01-02 DIAGNOSIS — I471 Supraventricular tachycardia: Secondary | ICD-10-CM | POA: Diagnosis not present

## 2019-01-02 DIAGNOSIS — I472 Ventricular tachycardia: Secondary | ICD-10-CM

## 2019-01-02 DIAGNOSIS — E785 Hyperlipidemia, unspecified: Secondary | ICD-10-CM

## 2019-01-02 DIAGNOSIS — I10 Essential (primary) hypertension: Secondary | ICD-10-CM

## 2019-01-02 NOTE — Patient Instructions (Signed)
Medication Instructions:  Your physician recommends that you continue on your current medications as directed. Please refer to the Current Medication list given to you today.  If you need a refill on your cardiac medications before your next appointment, please call your pharmacy.   Lab work: None.  If you have labs (blood work) drawn today and your tests are completely normal, you will receive your results only by: . MyChart Message (if you have MyChart) OR . A paper copy in the mail If you have any lab test that is abnormal or we need to change your treatment, we will call you to review the results.  Testing/Procedures: Your physician has requested that you have an echocardiogram. Echocardiography is a painless test that uses sound waves to create images of your heart. It provides your doctor with information about the size and shape of your heart and how well your heart's chambers and valves are working. This procedure takes approximately one hour. There are no restrictions for this procedure.    Follow-Up: At [facility], you and your health needs are our priority.  As part of our continuing mission to provide you with exceptional heart care, we have created designated Provider Care Teams.  These Care Teams include your primary Cardiologist (physician) and Advanced Practice Providers (APPs -  Physician Assistants and Nurse Practitioners) who all work together to provide you with the care you need, when you need it. You will need a follow up appointment in 6 months.  Please call our office 2 months in advance to schedule this appointment.  You may see No primary care provider on file. or another member of our [facility] Provider Team in [place]: Brian Munley, MD . Rajan Revankar, MD  Any Other Special Instructions Will Be Listed Below (If Applicable).   Echocardiogram An echocardiogram is a procedure that uses painless sound waves (ultrasound) to produce an image of the heart.  Images from an echocardiogram can provide important information about:  Signs of coronary artery disease (CAD).  Aneurysm detection. An aneurysm is a weak or damaged part of an artery wall that bulges out from the normal force of blood pumping through the body.  Heart size and shape. Changes in the size or shape of the heart can be associated with certain conditions, including heart failure, aneurysm, and CAD.  Heart muscle function.  Heart valve function.  Signs of a past heart attack.  Fluid buildup around the heart.  Thickening of the heart muscle.  A tumor or infectious growth around the heart valves. Tell a health care provider about:  Any allergies you have.  All medicines you are taking, including vitamins, herbs, eye drops, creams, and over-the-counter medicines.  Any blood disorders you have.  Any surgeries you have had.  Any medical conditions you have.  Whether you are pregnant or may be pregnant. What are the risks? Generally, this is a safe procedure. However, problems may occur, including:  Allergic reaction to dye (contrast) that may be used during the procedure. What happens before the procedure? No specific preparation is needed. You may eat and drink normally. What happens during the procedure?   An IV tube may be inserted into one of your veins.  You may receive contrast through this tube. A contrast is an injection that improves the quality of the pictures from your heart.  A gel will be applied to your chest.  A wand-like tool (transducer) will be moved over your chest. The gel will help to   transmit the sound waves from the transducer.  The sound waves will harmlessly bounce off of your heart to allow the heart images to be captured in real-time motion. The images will be recorded on a computer. The procedure may vary among health care providers and hospitals. What happens after the procedure?  You may return to your normal, everyday life,  including diet, activities, and medicines, unless your health care provider tells you not to do that. Summary  An echocardiogram is a procedure that uses painless sound waves (ultrasound) to produce an image of the heart.  Images from an echocardiogram can provide important information about the size and shape of your heart, heart muscle function, heart valve function, and fluid buildup around your heart.  You do not need to do anything to prepare before this procedure. You may eat and drink normally.  After the echocardiogram is completed, you may return to your normal, everyday life, unless your health care provider tells you not to do that. This information is not intended to replace advice given to you by your health care provider. Make sure you discuss any questions you have with your health care provider. Document Released: 04/08/2000 Document Revised: 08/02/2018 Document Reviewed: 05/14/2016 Elsevier Patient Education  2020 Elsevier Inc.    

## 2019-01-02 NOTE — Progress Notes (Signed)
Cardiology Office Note:    Date:  01/02/2019   ID:  Austin Drake, DOB Jul 02, 1950, MRN [id number]  PCP:  Angelina Sheriff, MD  Cardiologist:  Jenne Campus, MD    Referring MD: Angelina Sheriff, MD   Chief Complaint  Patient presents with  . Follow-up  Well doing well  History of Present Illness:    Austin Drake is a 68 y.o. male with supraventricular tachycardia, also history of nonsustained ventricular tachycardia, hypertension comes today to my office for follow-up overall doing great asymptomatic no chest pain tightness squeezing pressure burning chest he does have some dizziness he gets up very quickly but otherwise none.  Very active he plays golf he will he lifts weights he ride stationary bike overall very good shape.  Past Medical History:  Diagnosis Date  . Depression 11/21/2017  . Hypertension 11/21/2017  . Knee joint pain 07/26/2012    Past Surgical History:  Procedure Laterality Date  . COLONOSCOPY  2001  . COLONOSCOPY  2009  . COLONOSCOPY  2014  . COLONOSCOPY  2019  . HERNIA REPAIR    . HERNIA REPAIR  2019  . KIDNEY STONE SURGERY  2002  . LASIK  2002  . LITHOTRIPSY  2007    Current Medications: Current Meds  Medication Sig  . aspirin EC 81 MG tablet Take 1 tablet by mouth daily.  . benazepril (LOTENSIN) 5 MG tablet Take 1 tablet by mouth daily.  Marland Kitchen buPROPion (WELLBUTRIN SR) 150 MG 12 hr tablet Take 1 tablet by mouth daily.  . Cyanocobalamin (B-12 TR) 1000 MCG TBCR Take 1 tablet by mouth 2 (two) times daily.   . fluvastatin XL (LESCOL XL) 80 MG 24 hr tablet Take 80 mg by mouth daily.     Allergies:   Patient has no known allergies.   Social History   Socioeconomic History  . Marital status: Married    Spouse name: Not on file  . Number of children: Not on file  . Years of education: Not on file  . Highest education level: Not on file  Occupational History  . Not on file  Social Needs  . Financial resource strain: Not on file  .  Food insecurity    Worry: Not on file    Inability: Not on file  . Transportation needs    Medical: Not on file    Non-medical: Not on file  Tobacco Use  . Smoking status: Former Research scientist (life sciences)  . Smokeless tobacco: Never Used  Substance and Sexual Activity  . Alcohol use: Not on file  . Drug use: Not on file  . Sexual activity: Not on file  Lifestyle  . Physical activity    Days per week: Not on file    Minutes per session: Not on file  . Stress: Not on file  Relationships  . Social Herbalist on phone: Not on file    Gets together: Not on file    Attends religious service: Not on file    Active member of club or organization: Not on file    Attends meetings of clubs or organizations: Not on file    Relationship status: Not on file  Other Topics Concern  . Not on file  Social History Narrative  . Not on file     Family History: The patient's family history includes Diabetes in his father; Heart disease in his father. ROS:   Please see the history of present illness.  All 14 point review of systems negative except as described per history of present illness  EKGs/Labs/Other Studies Reviewed:      Recent Labs: No results found for requested labs within last 8760 hours.  Recent Lipid Panel No results found for: CHOL, TRIG, HDL, CHOLHDL, VLDL, LDLCALC, LDLDIRECT  Physical Exam:    VS:  BP 134/72   Pulse 74   Ht 6\' 2"  (1.88 m)   Wt 210 lb 9.6 oz (95.5 kg)   SpO2 98%   BMI 27.04 kg/m     Wt Readings from Last 3 Encounters:  01/02/19 210 lb 9.6 oz (95.5 kg)  07/02/18 217 lb 6.4 oz (98.6 kg)  01/03/18 233 lb 3.2 oz (105.8 kg)     GEN:  Well nourished, well developed in no acute distress HEENT: Normal NECK: No JVD; No carotid bruits LYMPHATICS: No lymphadenopathy CARDIAC: RRR, no murmurs, no rubs, no gallops RESPIRATORY:  Clear to auscultation without rales, wheezing or rhonchi  ABDOMEN: Soft, non-tender, non-distended MUSCULOSKELETAL:  No edema; No  deformity  SKIN: Warm and dry LOWER EXTREMITIES: no swelling NEUROLOGIC:  Alert and oriented x 3 PSYCHIATRIC:  Normal affect   ASSESSMENT:    1. SVT (supraventricular tachycardia) ([place])   2. Nonsustained ventricular tachycardia ([place])   3. Essential hypertension   4. Dyslipidemia    PLAN:    In order of problems listed above:  1. Supraventricular tachycardia denies having any we will continue present management. 2. History of nonsustained ventricular tachycardia no dizziness that would indicate arrhythmias etiology.  He did have 14% PVCs on last monitor.  I will ask you to have echocardiogram again to recheck left ventricular ejection fraction 3. Essential hypertension blood pressure well controlled we will continue present management. 4. Dyslipidemia followed by Dr. Rex Kras last fasting lipid profile I have is from July of this year showing LDL 71 HDL 62.  This is a good cholesterol profile will continue monitoring.   Medication Adjustments/Labs and Tests Ordered: Current medicines are reviewed at length with the patient today.  Concerns regarding medicines are outlined above.  No orders of the defined types were placed in this encounter.  Medication changes: No orders of the defined types were placed in this encounter.   Signed, Park Liter, MD, [facility] 01/02/2019 8:41 AM    [place]

## 2019-01-02 NOTE — Addendum Note (Signed)
Addended by: Ashok Norris on: 01/02/2019 08:50 AM   Modules accepted: Orders

## 2019-02-05 ENCOUNTER — Ambulatory Visit (INDEPENDENT_AMBULATORY_CARE_PROVIDER_SITE_OTHER): Payer: PPO

## 2019-02-05 ENCOUNTER — Other Ambulatory Visit: Payer: Self-pay

## 2019-02-05 DIAGNOSIS — I471 Supraventricular tachycardia: Secondary | ICD-10-CM

## 2019-02-05 NOTE — Progress Notes (Signed)
Complete echocardiogram has been performed.  Jimmy Kripa Foskey RDCS, RVT 

## 2019-07-01 ENCOUNTER — Encounter: Payer: Self-pay | Admitting: Cardiology

## 2019-07-01 ENCOUNTER — Other Ambulatory Visit: Payer: Self-pay

## 2019-07-01 ENCOUNTER — Ambulatory Visit: Payer: PPO | Admitting: Cardiology

## 2019-07-01 VITALS — BP 118/80 | HR 68 | Ht 74.0 in | Wt 216.0 lb

## 2019-07-01 DIAGNOSIS — I471 Supraventricular tachycardia: Secondary | ICD-10-CM | POA: Diagnosis not present

## 2019-07-01 DIAGNOSIS — I10 Essential (primary) hypertension: Secondary | ICD-10-CM | POA: Diagnosis not present

## 2019-07-01 DIAGNOSIS — E785 Hyperlipidemia, unspecified: Secondary | ICD-10-CM | POA: Diagnosis not present

## 2019-07-01 DIAGNOSIS — I472 Ventricular tachycardia: Secondary | ICD-10-CM

## 2019-07-01 DIAGNOSIS — I4729 Other ventricular tachycardia: Secondary | ICD-10-CM

## 2019-07-01 NOTE — Progress Notes (Signed)
Cardiology Office Note:    Date:  07/01/2019   ID:  Austin Drake, DOB January 11, 1951, MRN [id number]  PCP:  Angelina Sheriff, MD  Cardiologist:  Jenne Campus, MD    Referring MD: Angelina Sheriff, MD   No chief complaint on file. Doing very well  History of Present Illness:    Austin Drake is a 69 y.o. male with history of supraventricular tachycardia, status post ablation more than 20 years ago, ventricular tachycardia, frequent ventricular ectopy, essential hypertension.  Comes today to my office for follow-up.  Overall he is doing great.  He is asymptomatic.  Denies having any chest pain, tightness, pressure, burning in the chest.  Exercise on a regular basis no dizziness no passing out.  He does have stationary bike or playing golf for exercise typically 6 times a week.  No difficulty doing it.  Past Medical History:  Diagnosis Date  . Depression 11/21/2017  . Hypertension 11/21/2017  . Knee joint pain 07/26/2012    Past Surgical History:  Procedure Laterality Date  . COLONOSCOPY  2001  . COLONOSCOPY  2009  . COLONOSCOPY  2014  . COLONOSCOPY  2019  . HERNIA REPAIR    . HERNIA REPAIR  2019  . KIDNEY STONE SURGERY  2002  . LASIK  2002  . LITHOTRIPSY  2007    Current Medications: Current Meds  Medication Sig  . aspirin EC 81 MG tablet Take 1 tablet by mouth daily.  . benazepril (LOTENSIN) 5 MG tablet Take 1 tablet by mouth daily.  Marland Kitchen buPROPion (WELLBUTRIN SR) 150 MG 12 hr tablet Take 1 tablet by mouth daily.  . Cyanocobalamin (B-12 TR) 1000 MCG TBCR Take 1 tablet by mouth 2 (two) times daily.   . fluvastatin XL (LESCOL XL) 80 MG 24 hr tablet Take 80 mg by mouth daily.     Allergies:   Patient has no known allergies.   Social History   Socioeconomic History  . Marital status: Married    Spouse name: Not on file  . Number of children: Not on file  . Years of education: Not on file  . Highest education level: Not on file  Occupational History  . Not  on file  Tobacco Use  . Smoking status: Former Research scientist (life sciences)  . Smokeless tobacco: Never Used  Substance and Sexual Activity  . Alcohol use: Not on file  . Drug use: Not on file  . Sexual activity: Not on file  Other Topics Concern  . Not on file  Social History Narrative  . Not on file   Social Determinants of Health   Financial Resource Strain:   . Difficulty of Paying Living Expenses: Not on file  Food Insecurity:   . Worried About Charity fundraiser in the Last Year: Not on file  . Ran Out of Food in the Last Year: Not on file  Transportation Needs:   . Lack of Transportation (Medical): Not on file  . Lack of Transportation (Non-Medical): Not on file  Physical Activity:   . Days of Exercise per Week: Not on file  . Minutes of Exercise per Session: Not on file  Stress:   . Feeling of Stress : Not on file  Social Connections:   . Frequency of Communication with Friends and Family: Not on file  . Frequency of Social Gatherings with Friends and Family: Not on file  . Attends Religious Services: Not on file  . Active Member of Clubs or  Organizations: Not on file  . Attends Archivist Meetings: Not on file  . Marital Status: Not on file     Family History: The patient's family history includes Diabetes in his father; Heart disease in his father. ROS:   Please see the history of present illness.    All 14 point review of systems negative except as described per history of present illness  EKGs/Labs/Other Studies Reviewed:    EKG done today showed normal sinus rhythm, first-degree AV block, left axis deviation, nonspecific T wave changes  Recent Labs: No results found for requested labs within last 8760 hours.  Recent Lipid Panel No results found for: CHOL, TRIG, HDL, CHOLHDL, VLDL, LDLCALC, LDLDIRECT  Physical Exam:    VS:  BP 118/80   Pulse 68   Ht 6\' 2"  (1.88 m)   Wt 216 lb (98 kg)   BMI 27.73 kg/m     Wt Readings from Last 3 Encounters:  07/01/19 216  lb (98 kg)  01/02/19 210 lb 9.6 oz (95.5 kg)  07/02/18 217 lb 6.4 oz (98.6 kg)     GEN:  Well nourished, well developed in no acute distress HEENT: Normal NECK: No JVD; No carotid bruits LYMPHATICS: No lymphadenopathy CARDIAC: RRR, no murmurs, no rubs, no gallops RESPIRATORY:  Clear to auscultation without rales, wheezing or rhonchi  ABDOMEN: Soft, non-tender, non-distended MUSCULOSKELETAL:  No edema; No deformity  SKIN: Warm and dry LOWER EXTREMITIES: no swelling NEUROLOGIC:  Alert and oriented x 3 PSYCHIATRIC:  Normal affect   ASSESSMENT:    1. SVT (supraventricular tachycardia) ([place])   2. Nonsustained ventricular tachycardia ([place])   3. Essential hypertension   4. Dyslipidemia    PLAN:    In order of problems listed above:  1. History of supraventricular tachycardia denies having any palpitations.  We will continue present medications. 2. Nonsustained ventricular tachycardia echocardiogram showed normal left ventricle ejection fraction, we recheck echocardiogram recently which I reviewed with the patient.  There was also some significant amount of PVCs close to 50%.  However likely ejection fraction still normal he is completely asymptomatic.  We will continue conservative approach 3. Essential hypertension blood pressure well controlled continue present management. 4. Dyslipidemia followed by internal medicine team.  Will call office to get fasting profile.   Medication Adjustments/Labs and Tests Ordered: Current medicines are reviewed at length with the patient today.  Concerns regarding medicines are outlined above.  No orders of the defined types were placed in this encounter.  Medication changes: No orders of the defined types were placed in this encounter.   Signed, Park Liter, MD, [facility] 07/01/2019 9:38 AM    [place]

## 2019-07-01 NOTE — Patient Instructions (Signed)

## 2019-08-23 ENCOUNTER — Encounter: Payer: Self-pay | Admitting: Cardiology

## 2019-11-15 DIAGNOSIS — D519 Vitamin B12 deficiency anemia, unspecified: Secondary | ICD-10-CM | POA: Diagnosis not present

## 2019-11-15 DIAGNOSIS — Z0000 Encounter for general adult medical examination without abnormal findings: Secondary | ICD-10-CM | POA: Diagnosis not present

## 2019-11-15 DIAGNOSIS — I10 Essential (primary) hypertension: Secondary | ICD-10-CM | POA: Diagnosis not present

## 2019-11-15 DIAGNOSIS — D510 Vitamin B12 deficiency anemia due to intrinsic factor deficiency: Secondary | ICD-10-CM | POA: Diagnosis not present

## 2019-11-15 DIAGNOSIS — Z6829 Body mass index (BMI) 29.0-29.9, adult: Secondary | ICD-10-CM | POA: Diagnosis not present

## 2019-11-15 DIAGNOSIS — E782 Mixed hyperlipidemia: Secondary | ICD-10-CM | POA: Diagnosis not present

## 2019-11-15 DIAGNOSIS — Z9181 History of falling: Secondary | ICD-10-CM | POA: Diagnosis not present

## 2019-11-15 DIAGNOSIS — F419 Anxiety disorder, unspecified: Secondary | ICD-10-CM | POA: Diagnosis not present

## 2019-11-15 DIAGNOSIS — Z79899 Other long term (current) drug therapy: Secondary | ICD-10-CM | POA: Diagnosis not present

## 2019-11-15 DIAGNOSIS — J302 Other seasonal allergic rhinitis: Secondary | ICD-10-CM | POA: Diagnosis not present

## 2019-11-18 DIAGNOSIS — D485 Neoplasm of uncertain behavior of skin: Secondary | ICD-10-CM | POA: Diagnosis not present

## 2019-11-18 DIAGNOSIS — L814 Other melanin hyperpigmentation: Secondary | ICD-10-CM | POA: Diagnosis not present

## 2019-11-18 DIAGNOSIS — D2262 Melanocytic nevi of left upper limb, including shoulder: Secondary | ICD-10-CM | POA: Diagnosis not present

## 2019-11-18 DIAGNOSIS — L570 Actinic keratosis: Secondary | ICD-10-CM | POA: Diagnosis not present

## 2019-11-18 DIAGNOSIS — D1801 Hemangioma of skin and subcutaneous tissue: Secondary | ICD-10-CM | POA: Diagnosis not present

## 2019-11-18 DIAGNOSIS — D225 Melanocytic nevi of trunk: Secondary | ICD-10-CM | POA: Diagnosis not present

## 2019-11-18 DIAGNOSIS — L821 Other seborrheic keratosis: Secondary | ICD-10-CM | POA: Diagnosis not present

## 2019-12-13 DIAGNOSIS — H524 Presbyopia: Secondary | ICD-10-CM | POA: Diagnosis not present

## 2020-01-16 ENCOUNTER — Ambulatory Visit: Payer: PPO | Admitting: Cardiology

## 2020-01-16 ENCOUNTER — Encounter: Payer: Self-pay | Admitting: Cardiology

## 2020-01-16 ENCOUNTER — Other Ambulatory Visit: Payer: Self-pay

## 2020-01-16 VITALS — BP 128/78 | HR 68 | Ht 74.0 in | Wt 217.2 lb

## 2020-01-16 DIAGNOSIS — E785 Hyperlipidemia, unspecified: Secondary | ICD-10-CM

## 2020-01-16 DIAGNOSIS — I472 Ventricular tachycardia: Secondary | ICD-10-CM

## 2020-01-16 DIAGNOSIS — I10 Essential (primary) hypertension: Secondary | ICD-10-CM | POA: Diagnosis not present

## 2020-01-16 DIAGNOSIS — I471 Supraventricular tachycardia: Secondary | ICD-10-CM | POA: Diagnosis not present

## 2020-01-16 DIAGNOSIS — I4729 Other ventricular tachycardia: Secondary | ICD-10-CM

## 2020-01-16 NOTE — Progress Notes (Signed)
Cardiology Office Note:    Date:  01/16/2020   ID:  Austin Drake, DOB 16-May-1950, MRN [id number]  PCP:  Angelina Sheriff, MD  Cardiologist:  Jenne Campus, MD    Referring MD: Angelina Sheriff, MD   No chief complaint on file. I am doing very well  History of Present Illness:    Austin Drake is a 68 y.o. male with past medical history significant for supraventricular tachycardia status post SVT ablation done more than 20 years ago, ventricular tachycardia, frequent ventricular ectopy, frequent supraventricular ectopy, essential hypertension, dyslipidemia. Comes today to my office for follow-up overall he is doing great he exercised on the regular basis he plays golf he ride stationary bike for at least half an hour and also does do some weightlifting feeling good denies have any palpitations no chest pain tightness squeezing pressure burning chest.  Past Medical History:  Diagnosis Date  . Depression 11/21/2017  . Hypertension 11/21/2017  . Knee joint pain 07/26/2012    Past Surgical History:  Procedure Laterality Date  . COLONOSCOPY  2001  . COLONOSCOPY  2009  . COLONOSCOPY  2014  . COLONOSCOPY  2019  . HERNIA REPAIR    . HERNIA REPAIR  2019  . KIDNEY STONE SURGERY  2002  . LASIK  2002  . LITHOTRIPSY  2007    Current Medications: Current Meds  Medication Sig  . aspirin EC 81 MG tablet Take 1 tablet by mouth daily.  . benazepril (LOTENSIN) 5 MG tablet Take 1 tablet by mouth daily.  Marland Kitchen buPROPion (WELLBUTRIN SR) 100 MG 12 hr tablet Take 100 mg by mouth daily.  . Cyanocobalamin (B-12 TR) 1000 MCG TBCR Take 1 tablet by mouth 2 (two) times daily.   . fluvastatin XL (LESCOL XL) 80 MG 24 hr tablet Take 80 mg by mouth daily.     Allergies:   Patient has no known allergies.   Social History   Socioeconomic History  . Marital status: Married    Spouse name: Not on file  . Number of children: Not on file  . Years of education: Not on file  . Highest  education level: Not on file  Occupational History  . Not on file  Tobacco Use  . Smoking status: Former Research scientist (life sciences)  . Smokeless tobacco: Never Used  Substance and Sexual Activity  . Alcohol use: Not on file  . Drug use: Not on file  . Sexual activity: Not on file  Other Topics Concern  . Not on file  Social History Narrative  . Not on file   Social Determinants of Health   Financial Resource Strain:   . Difficulty of Paying Living Expenses: Not on file  Food Insecurity:   . Worried About Charity fundraiser in the Last Year: Not on file  . Ran Out of Food in the Last Year: Not on file  Transportation Needs:   . Lack of Transportation (Medical): Not on file  . Lack of Transportation (Non-Medical): Not on file  Physical Activity:   . Days of Exercise per Week: Not on file  . Minutes of Exercise per Session: Not on file  Stress:   . Feeling of Stress : Not on file  Social Connections:   . Frequency of Communication with Friends and Family: Not on file  . Frequency of Social Gatherings with Friends and Family: Not on file  . Attends Religious Services: Not on file  . Active Member of Clubs or  Organizations: Not on file  . Attends Archivist Meetings: Not on file  . Marital Status: Not on file     Family History: The patient's family history includes Diabetes in his father; Heart disease in his father. ROS:   Please see the history of present illness.    All 14 point review of systems negative except as described per history of present illness  EKGs/Labs/Other Studies Reviewed:      Recent Labs: No results found for requested labs within last 8760 hours.  Recent Lipid Panel No results found for: CHOL, TRIG, HDL, CHOLHDL, VLDL, LDLCALC, LDLDIRECT  Physical Exam:    VS:  BP 128/78   Pulse 68   Ht 6\' 2"  (1.88 m)   Wt 217 lb 3.2 oz (98.5 kg)   SpO2 96%   BMI 27.89 kg/m     Wt Readings from Last 3 Encounters:  01/16/20 217 lb 3.2 oz (98.5 kg)  07/01/19  216 lb (98 kg)  01/02/19 210 lb 9.6 oz (95.5 kg)     GEN:  Well nourished, well developed in no acute distress HEENT: Normal NECK: No JVD; No carotid bruits LYMPHATICS: No lymphadenopathy CARDIAC: RRR, no murmurs, no rubs, no gallops RESPIRATORY:  Clear to auscultation without rales, wheezing or rhonchi  ABDOMEN: Soft, non-tender, non-distended MUSCULOSKELETAL:  No edema; No deformity  SKIN: Warm and dry LOWER EXTREMITIES: no swelling NEUROLOGIC:  Alert and oriented x 3 PSYCHIATRIC:  Normal affect   ASSESSMENT:    1. SVT (supraventricular tachycardia) ([place])   2. Nonsustained ventricular tachycardia ([place])   3. Essential hypertension   4. Dyslipidemia    PLAN:    In order of problems listed above:  1. Supraventricular tachycardia status post ablation more than 20 years ago, asymptomatic denies having issues. 2. History of nonsustained ventricular tachycardia but overall work-up negative, low risk. Denies have any palpitations no syncope. We will continue present management. 3. Essential hypertension blood pressure perfectly controlled with today's blood pressure 128/78. 4. Dyslipidemia he is on Lescol which is moderate intensity statin 80 which I will continue will contact primary care physician to get his fasting lipid profile.   Medication Adjustments/Labs and Tests Ordered: Current medicines are reviewed at length with the patient today.  Concerns regarding medicines are outlined above.  No orders of the defined types were placed in this encounter.  Medication changes: No orders of the defined types were placed in this encounter.   Signed, Park Liter, MD, [facility] 01/16/2020 1:29 PM    [place]

## 2020-01-16 NOTE — Patient Instructions (Signed)

## 2020-06-23 DIAGNOSIS — L851 Acquired keratosis [keratoderma] palmaris et plantaris: Secondary | ICD-10-CM | POA: Insufficient documentation

## 2020-06-23 HISTORY — DX: Acquired keratosis (keratoderma) palmaris et plantaris: L85.1

## 2020-07-10 ENCOUNTER — Other Ambulatory Visit: Payer: Self-pay

## 2020-07-13 ENCOUNTER — Ambulatory Visit: Payer: PPO | Admitting: Cardiology

## 2020-07-13 ENCOUNTER — Other Ambulatory Visit: Payer: Self-pay

## 2020-07-13 ENCOUNTER — Encounter: Payer: Self-pay | Admitting: Cardiology

## 2020-07-13 VITALS — BP 114/78 | HR 58 | Ht 74.0 in | Wt 222.0 lb

## 2020-07-13 DIAGNOSIS — I471 Supraventricular tachycardia, unspecified: Secondary | ICD-10-CM

## 2020-07-13 DIAGNOSIS — I10 Essential (primary) hypertension: Secondary | ICD-10-CM

## 2020-07-13 DIAGNOSIS — E785 Hyperlipidemia, unspecified: Secondary | ICD-10-CM

## 2020-07-13 DIAGNOSIS — I4729 Other ventricular tachycardia: Secondary | ICD-10-CM

## 2020-07-13 DIAGNOSIS — I472 Ventricular tachycardia: Secondary | ICD-10-CM

## 2020-07-13 NOTE — Progress Notes (Signed)
Cardiology Office Note:    Date:  07/13/2020   ID:  Austin Drake, DOB 03/27/1951, MRN [id number]  PCP:  Angelina Sheriff, MD  Cardiologist:  Jenne Campus, MD    Referring MD: Angelina Sheriff, MD   Chief Complaint  Patient presents with  . Follow-up  I am doing fine  History of Present Illness:    Austin Drake is a 71 y.o. male with past medical history significant for supraventricular tachycardia, status post SVT ablation done more than 20 years ago, ventricular tachycardia, frequent ventricular ectopy, essential hypertension, dyslipidemia. Comes today 2 months of follow-up.  He still very active he plays golf few times a week he walks, also eustachian bike in the days that he does not play golf he spent 30 minutes on the bike with no difficulties.  There is no chest pain tightness squeezing pressure burning chest overall doing quite well.  Denies have any dizziness passing out no palpitations  Past Medical History:  Diagnosis Date  . Acquired plantar porokeratosis 06/23/2020  . Depression 11/21/2017  . Dyslipidemia 11/21/2017  . Hypertension 11/21/2017  . Knee joint pain 07/26/2012  . Nonsustained ventricular tachycardia ([place]) 01/03/2018  . SVT (supraventricular tachycardia) ([place]) 11/21/2017   Status post ablation in 2003 done by Dr. Dorothea Ogle    Past Surgical History:  Procedure Laterality Date  . COLONOSCOPY  2001  . COLONOSCOPY  2009  . COLONOSCOPY  2014  . COLONOSCOPY  2019  . HERNIA REPAIR    . HERNIA REPAIR  2019  . KIDNEY STONE SURGERY  2002  . LASIK  2002  . LITHOTRIPSY  2007    Current Medications: Current Meds  Medication Sig  . aspirin EC 81 MG tablet Take 1 tablet by mouth daily.  . benazepril (LOTENSIN) 5 MG tablet Take 1 tablet by mouth daily.  Marland Kitchen buPROPion (WELLBUTRIN SR) 100 MG 12 hr tablet Take 100 mg by mouth daily.  . Cyanocobalamin 1000 MCG TBCR Take 1 tablet by mouth 2 (two) times daily.   . fluvastatin XL (LESCOL XL) 80 MG 24 hr tablet  Take 80 mg by mouth daily.     Allergies:   Patient has no known allergies.   Social History   Socioeconomic History  . Marital status: Married    Spouse name: Not on file  . Number of children: Not on file  . Years of education: Not on file  . Highest education level: Not on file  Occupational History  . Not on file  Tobacco Use  . Smoking status: Former Research scientist (life sciences)  . Smokeless tobacco: Never Used  Substance and Sexual Activity  . Alcohol use: Not on file  . Drug use: Not on file  . Sexual activity: Not on file  Other Topics Concern  . Not on file  Social History Narrative  . Not on file   Social Determinants of Health   Financial Resource Strain: Not on file  Food Insecurity: Not on file  Transportation Needs: Not on file  Physical Activity: Not on file  Stress: Not on file  Social Connections: Not on file     Family History: The patient's family history includes Diabetes in his father; Heart disease in his father. ROS:   Please see the history of present illness.    All 14 point review of systems negative except as described per history of present illness  EKGs/Labs/Other Studies Reviewed:      Recent Labs: No results found for requested  labs within last 8760 hours.  Recent Lipid Panel No results found for: CHOL, TRIG, HDL, CHOLHDL, VLDL, LDLCALC, LDLDIRECT  Physical Exam:    VS:  BP 114/78 (BP Location: Left Arm, Patient Position: Sitting)   Pulse (!) 58   Ht 6\' 2"  (1.88 m)   Wt 222 lb (100.7 kg)   SpO2 95%   BMI 28.50 kg/m     Wt Readings from Last 3 Encounters:  07/13/20 222 lb (100.7 kg)  01/16/20 217 lb 3.2 oz (98.5 kg)  07/01/19 216 lb (98 kg)     GEN:  Well nourished, well developed in no acute distress HEENT: Normal NECK: No JVD; No carotid bruits LYMPHATICS: No lymphadenopathy CARDIAC: RRR, no murmurs, no rubs, no gallops RESPIRATORY:  Clear to auscultation without rales, wheezing or rhonchi  ABDOMEN: Soft, non-tender,  non-distended MUSCULOSKELETAL:  No edema; No deformity  SKIN: Warm and dry LOWER EXTREMITIES: no swelling NEUROLOGIC:  Alert and oriented x 3 PSYCHIATRIC:  Normal affect   ASSESSMENT:    1. SVT (supraventricular tachycardia) ([place])   2. Nonsustained ventricular tachycardia ([place])   3. Primary hypertension   4. Dyslipidemia    PLAN:    In order of problems listed above:  1. Supraventricular tachycardia, status post ablation done more than 20 years ago.  Asymptomatic denies having issue doing well from that point review. 2. History of nonsustained ventricular tachycardia work-up negative no dizziness no syncope.  EKG today shows some PVCs.  I will ask him to have echocardiogram to recheck left ventricle ejection fraction. 3. Dyslipidemia, I did review his K PN which show me he is data from summer of last year with HDL of 63.  So for some reason I do not have LDL.  We will contact primary care physician to get a copy of it.  He is scheduled to have his annual physical in the summer and his cholesterol will be rechecked. 4. Essential hypertension, blood pressure well controlled continue present management. 5. We did talk about healthy lifestyle that he already has.  He is active and I encouraged him to continue.   Medication Adjustments/Labs and Tests Ordered: Current medicines are reviewed at length with the patient today.  Concerns regarding medicines are outlined above.  No orders of the defined types were placed in this encounter.  Medication changes: No orders of the defined types were placed in this encounter.   Signed, Park Liter, MD, [facility] 07/13/2020 8:35 AM    [place]

## 2020-07-13 NOTE — Patient Instructions (Signed)
Medication Instructions:  Your physician recommends that you continue on your current medications as directed. Please refer to the Current Medication list given to you today.  *If you need a refill on your cardiac medications before your next appointment, please call your pharmacy*   Lab Work: None If you have labs (blood work) drawn today and your tests are completely normal, you will receive your results only by: . MyChart Message (if you have MyChart) OR . A paper copy in the mail If you have any lab test that is abnormal or we need to change your treatment, we will call you to review the results.   Testing/Procedures: Your physician has requested that you have an echocardiogram. Echocardiography is a painless test that uses sound waves to create images of your heart. It provides your doctor with information about the size and shape of your heart and how well your heart's chambers and valves are working. This procedure takes approximately one hour. There are no restrictions for this procedure.     Follow-Up: At [facility], you and your health needs are our priority.  As part of our continuing mission to provide you with exceptional heart care, we have created designated Provider Care Teams.  These Care Teams include your primary Cardiologist (physician) and Advanced Practice Providers (APPs -  Physician Assistants and Nurse Practitioners) who all work together to provide you with the care you need, when you need it.  We recommend signing up for the patient portal called "MyChart".  Sign up information is provided on this After Visit Summary.  MyChart is used to connect with patients for Virtual Visits (Telemedicine).  Patients are able to view lab/test results, encounter notes, upcoming appointments, etc.  Non-urgent messages can be sent to your provider as well.   To learn more about what you can do with MyChart, go to [email or web address].    Your next appointment:   1  year(s)  The format for your next appointment:   In Person  Provider:   Zaquan Krasowski, MD   Other Instructions   Echocardiogram An echocardiogram is a test that uses sound waves (ultrasound) to produce images of the heart. Images from an echocardiogram can provide important information about:  Heart size and shape.  The size and thickness and movement of your heart's walls.  Heart muscle function and strength.  Heart valve function or if you have stenosis. Stenosis is when the heart valves are too narrow.  If blood is flowing backward through the heart valves (regurgitation).  A tumor or infectious growth around the heart valves.  Areas of heart muscle that are not working well because of poor blood flow or injury from a heart attack.  Aneurysm detection. An aneurysm is a weak or damaged part of an artery wall. The wall bulges out from the normal force of blood pumping through the body. Tell a health care provider about:  Any allergies you have.  All medicines you are taking, including vitamins, herbs, eye drops, creams, and over-the-counter medicines.  Any blood disorders you have.  Any surgeries you have had.  Any medical conditions you have.  Whether you are pregnant or may be pregnant. What are the risks? Generally, this is a safe test. However, problems may occur, including an allergic reaction to dye (contrast) that may be used during the test. What happens before the test? No specific preparation is needed. You may eat and drink normally. What happens during the test?  You will take off your   clothes from the waist up and put on a hospital gown.  Electrodes or electrocardiogram (ECG)patches may be placed on your chest. The electrodes or patches are then connected to a device that monitors your heart rate and rhythm.  You will lie down on a table for an ultrasound exam. A gel will be applied to your chest to help sound waves pass through your skin.  A  handheld device, called a transducer, will be pressed against your chest and moved over your heart. The transducer produces sound waves that travel to your heart and bounce back (or "echo" back) to the transducer. These sound waves will be captured in real-time and changed into images of your heart that can be viewed on a video monitor. The images will be recorded on a computer and reviewed by your health care provider.  You may be asked to change positions or hold your breath for a short time. This makes it easier to get different views or better views of your heart.  In some cases, you may receive contrast through an IV in one of your veins. This can improve the quality of the pictures from your heart. The procedure may vary among health care providers and hospitals.   What can I expect after the test? You may return to your normal, everyday life, including diet, activities, and medicines, unless your health care provider tells you not to do that. Follow these instructions at home:  It is up to you to get the results of your test. Ask your health care provider, or the department that is doing the test, when your results will be ready.  Keep all follow-up visits. This is important. Summary  An echocardiogram is a test that uses sound waves (ultrasound) to produce images of the heart.  Images from an echocardiogram can provide important information about the size and shape of your heart, heart muscle function, heart valve function, and other possible heart problems.  You do not need to do anything to prepare before this test. You may eat and drink normally.  After the echocardiogram is completed, you may return to your normal, everyday life, unless your health care provider tells you not to do that. This information is not intended to replace advice given to you by your health care provider. Make sure you discuss any questions you have with your health care provider. Document Revised:  12/03/2019 Document Reviewed: 12/03/2019 Elsevier Patient Education  2021 Elsevier Inc.   

## 2020-07-14 DIAGNOSIS — L851 Acquired keratosis [keratoderma] palmaris et plantaris: Secondary | ICD-10-CM | POA: Diagnosis not present

## 2020-08-03 ENCOUNTER — Other Ambulatory Visit: Payer: Self-pay

## 2020-08-03 ENCOUNTER — Ambulatory Visit (INDEPENDENT_AMBULATORY_CARE_PROVIDER_SITE_OTHER): Payer: PPO

## 2020-08-03 DIAGNOSIS — I10 Essential (primary) hypertension: Secondary | ICD-10-CM | POA: Diagnosis not present

## 2020-08-03 DIAGNOSIS — E785 Hyperlipidemia, unspecified: Secondary | ICD-10-CM | POA: Diagnosis not present

## 2020-08-03 DIAGNOSIS — I4729 Other ventricular tachycardia: Secondary | ICD-10-CM

## 2020-08-03 DIAGNOSIS — I471 Supraventricular tachycardia: Secondary | ICD-10-CM | POA: Diagnosis not present

## 2020-08-03 DIAGNOSIS — I472 Ventricular tachycardia: Secondary | ICD-10-CM

## 2020-08-03 LAB — ECHOCARDIOGRAM COMPLETE
Area-P 1/2: 2.41 cm2
S' Lateral: 2.7 cm

## 2020-08-03 NOTE — Progress Notes (Signed)
Complete echocardiogram performed.  Jimmy Zynasia Burklow RDCS, RVT  

## 2020-10-20 DIAGNOSIS — B3783 Candidal cheilitis: Secondary | ICD-10-CM | POA: Diagnosis not present

## 2020-10-20 DIAGNOSIS — Z6829 Body mass index (BMI) 29.0-29.9, adult: Secondary | ICD-10-CM | POA: Diagnosis not present

## 2020-10-20 DIAGNOSIS — I10 Essential (primary) hypertension: Secondary | ICD-10-CM | POA: Diagnosis not present

## 2020-11-16 DIAGNOSIS — D510 Vitamin B12 deficiency anemia due to intrinsic factor deficiency: Secondary | ICD-10-CM | POA: Diagnosis not present

## 2020-11-16 DIAGNOSIS — Z8679 Personal history of other diseases of the circulatory system: Secondary | ICD-10-CM | POA: Diagnosis not present

## 2020-11-16 DIAGNOSIS — J302 Other seasonal allergic rhinitis: Secondary | ICD-10-CM | POA: Diagnosis not present

## 2020-11-16 DIAGNOSIS — Z6829 Body mass index (BMI) 29.0-29.9, adult: Secondary | ICD-10-CM | POA: Diagnosis not present

## 2020-11-16 DIAGNOSIS — Z0000 Encounter for general adult medical examination without abnormal findings: Secondary | ICD-10-CM | POA: Diagnosis not present

## 2020-11-16 DIAGNOSIS — F331 Major depressive disorder, recurrent, moderate: Secondary | ICD-10-CM | POA: Diagnosis not present

## 2020-11-16 DIAGNOSIS — Z79899 Other long term (current) drug therapy: Secondary | ICD-10-CM | POA: Diagnosis not present

## 2020-11-16 DIAGNOSIS — I10 Essential (primary) hypertension: Secondary | ICD-10-CM | POA: Diagnosis not present

## 2020-11-16 DIAGNOSIS — E782 Mixed hyperlipidemia: Secondary | ICD-10-CM | POA: Diagnosis not present

## 2020-11-18 DIAGNOSIS — D1801 Hemangioma of skin and subcutaneous tissue: Secondary | ICD-10-CM | POA: Diagnosis not present

## 2020-11-18 DIAGNOSIS — L570 Actinic keratosis: Secondary | ICD-10-CM | POA: Diagnosis not present

## 2020-11-18 DIAGNOSIS — L814 Other melanin hyperpigmentation: Secondary | ICD-10-CM | POA: Diagnosis not present

## 2020-11-18 DIAGNOSIS — K130 Diseases of lips: Secondary | ICD-10-CM | POA: Diagnosis not present

## 2020-11-18 DIAGNOSIS — L308 Other specified dermatitis: Secondary | ICD-10-CM | POA: Diagnosis not present

## 2020-11-18 DIAGNOSIS — L821 Other seborrheic keratosis: Secondary | ICD-10-CM | POA: Diagnosis not present

## 2020-12-16 DIAGNOSIS — H524 Presbyopia: Secondary | ICD-10-CM | POA: Diagnosis not present

## 2021-04-23 DIAGNOSIS — F331 Major depressive disorder, recurrent, moderate: Secondary | ICD-10-CM | POA: Diagnosis not present

## 2021-04-23 DIAGNOSIS — E782 Mixed hyperlipidemia: Secondary | ICD-10-CM | POA: Diagnosis not present

## 2021-04-23 DIAGNOSIS — I10 Essential (primary) hypertension: Secondary | ICD-10-CM | POA: Diagnosis not present

## 2021-05-18 DIAGNOSIS — K4020 Bilateral inguinal hernia, without obstruction or gangrene, not specified as recurrent: Secondary | ICD-10-CM | POA: Diagnosis not present

## 2021-05-18 DIAGNOSIS — I10 Essential (primary) hypertension: Secondary | ICD-10-CM | POA: Diagnosis not present

## 2021-05-18 DIAGNOSIS — K4090 Unilateral inguinal hernia, without obstruction or gangrene, not specified as recurrent: Secondary | ICD-10-CM | POA: Diagnosis not present

## 2021-05-18 DIAGNOSIS — R1032 Left lower quadrant pain: Secondary | ICD-10-CM | POA: Diagnosis not present

## 2021-05-19 DIAGNOSIS — K4091 Unilateral inguinal hernia, without obstruction or gangrene, recurrent: Secondary | ICD-10-CM | POA: Insufficient documentation

## 2021-05-21 DIAGNOSIS — I440 Atrioventricular block, first degree: Secondary | ICD-10-CM | POA: Diagnosis not present

## 2021-05-21 DIAGNOSIS — K4091 Unilateral inguinal hernia, without obstruction or gangrene, recurrent: Secondary | ICD-10-CM | POA: Diagnosis not present

## 2021-05-21 DIAGNOSIS — I498 Other specified cardiac arrhythmias: Secondary | ICD-10-CM | POA: Diagnosis not present

## 2021-05-21 DIAGNOSIS — Z01818 Encounter for other preprocedural examination: Secondary | ICD-10-CM | POA: Diagnosis not present

## 2021-05-24 DIAGNOSIS — K4091 Unilateral inguinal hernia, without obstruction or gangrene, recurrent: Secondary | ICD-10-CM | POA: Diagnosis not present

## 2021-05-24 DIAGNOSIS — K4090 Unilateral inguinal hernia, without obstruction or gangrene, not specified as recurrent: Secondary | ICD-10-CM | POA: Diagnosis not present

## 2021-05-24 DIAGNOSIS — K4021 Bilateral inguinal hernia, without obstruction or gangrene, recurrent: Secondary | ICD-10-CM | POA: Diagnosis not present

## 2021-06-22 DIAGNOSIS — I10 Essential (primary) hypertension: Secondary | ICD-10-CM | POA: Diagnosis not present

## 2021-06-22 DIAGNOSIS — E782 Mixed hyperlipidemia: Secondary | ICD-10-CM | POA: Diagnosis not present

## 2021-06-22 DIAGNOSIS — F331 Major depressive disorder, recurrent, moderate: Secondary | ICD-10-CM | POA: Diagnosis not present

## 2021-07-01 ENCOUNTER — Other Ambulatory Visit: Payer: Self-pay

## 2021-07-01 ENCOUNTER — Ambulatory Visit (HOSPITAL_COMMUNITY)
Admission: RE | Admit: 2021-07-01 | Discharge: 2021-07-01 | Disposition: A | Discharge status: Home / Self Care | Payer: PPO | Source: Ambulatory Visit | Attending: Family Medicine | Admitting: Family Medicine

## 2021-07-01 ENCOUNTER — Other Ambulatory Visit: Payer: Self-pay | Admitting: Family Medicine

## 2021-07-01 ENCOUNTER — Other Ambulatory Visit (HOSPITAL_COMMUNITY): Payer: Self-pay | Admitting: Family Medicine

## 2021-07-01 DIAGNOSIS — R109 Unspecified abdominal pain: Secondary | ICD-10-CM | POA: Insufficient documentation

## 2021-07-01 DIAGNOSIS — Z6829 Body mass index (BMI) 29.0-29.9, adult: Secondary | ICD-10-CM | POA: Diagnosis not present

## 2021-07-01 DIAGNOSIS — M545 Low back pain, unspecified: Secondary | ICD-10-CM | POA: Insufficient documentation

## 2021-07-01 DIAGNOSIS — K7689 Other specified diseases of liver: Secondary | ICD-10-CM | POA: Diagnosis not present

## 2021-07-01 DIAGNOSIS — K5730 Diverticulosis of large intestine without perforation or abscess without bleeding: Secondary | ICD-10-CM | POA: Diagnosis not present

## 2021-07-01 DIAGNOSIS — K4090 Unilateral inguinal hernia, without obstruction or gangrene, not specified as recurrent: Secondary | ICD-10-CM | POA: Diagnosis not present

## 2021-07-01 DIAGNOSIS — N1330 Unspecified hydronephrosis: Secondary | ICD-10-CM | POA: Diagnosis not present

## 2021-07-01 DIAGNOSIS — I10 Essential (primary) hypertension: Secondary | ICD-10-CM | POA: Diagnosis not present

## 2021-07-13 ENCOUNTER — Other Ambulatory Visit: Payer: Self-pay

## 2021-07-14 ENCOUNTER — Encounter: Payer: Self-pay | Admitting: Cardiology

## 2021-07-14 ENCOUNTER — Ambulatory Visit (INDEPENDENT_AMBULATORY_CARE_PROVIDER_SITE_OTHER): Payer: PPO

## 2021-07-14 ENCOUNTER — Ambulatory Visit: Payer: PPO | Admitting: Cardiology

## 2021-07-14 VITALS — BP 126/82 | HR 63 | Ht 73.0 in | Wt 222.4 lb

## 2021-07-14 DIAGNOSIS — I4729 Other ventricular tachycardia: Secondary | ICD-10-CM | POA: Diagnosis not present

## 2021-07-14 DIAGNOSIS — E785 Hyperlipidemia, unspecified: Secondary | ICD-10-CM

## 2021-07-14 DIAGNOSIS — I471 Supraventricular tachycardia: Secondary | ICD-10-CM | POA: Diagnosis not present

## 2021-07-14 DIAGNOSIS — I10 Essential (primary) hypertension: Secondary | ICD-10-CM | POA: Diagnosis not present

## 2021-07-14 NOTE — Progress Notes (Signed)
?Cardiology Office Note:   ? ?Date:  07/14/2021  ? ?ID:  Austin Drake, DOB Apr 03, 1951, MRN [id number] ? ?PCP:  Angelina Sheriff, MD  ?Cardiologist:  Jenne Campus, MD   ? ?Referring MD: Angelina Sheriff, MD  ? ?Chief Complaint  ?Patient presents with  ? Follow-up  ?Cardiac wise some doing well but I did have kidney stone also hernia surgery ? ?History of Present Illness:   ? ?Austin Drake is a 71 y.o. male past medical history significant for supraventricular tachycardia, status post SVT ablation done more than 20 years ago, ventricular tachycardia, frequent ventricular ectopy, essential hypertension, dyslipidemia. ?He is in my office for follow-up.  Overall doing well cardiac wise however he end up having hernia surgery as well as some kidney stone.  He is to play golf on the regular basis being active but now because of all this problems mentioning above he cannot do it he is upset with himself because he gained few pounds and he cannot drop it because he cannot do his routine exercises.  Denies have any dizziness passing out, chest pain tightness squeezing pressure burning chest. ? ?Past Medical History:  ?Diagnosis Date  ? Acquired plantar porokeratosis 06/23/2020  ? Depression 11/21/2017  ? Dyslipidemia 11/21/2017  ? Hypertension 11/21/2017  ? Knee joint pain 07/26/2012  ? Nonsustained ventricular tachycardia 01/03/2018  ? SVT (supraventricular tachycardia) ([place]) 11/21/2017  ? Status post ablation in 2003 done by Dr. Dorothea Ogle  ? ? ?Past Surgical History:  ?Procedure Laterality Date  ? COLONOSCOPY  2001  ? COLONOSCOPY  2009  ? COLONOSCOPY  2014  ? COLONOSCOPY  2019  ? HERNIA REPAIR    ? HERNIA REPAIR  2019  ? KIDNEY STONE SURGERY  2002  ? LASIK  2002  ? LITHOTRIPSY  2007  ? ? ?Current Medications: ?Current Meds  ?Medication Sig  ? aspirin EC 81 MG tablet Take 1 tablet by mouth daily.  ? atorvastatin (LIPITOR) 20 MG tablet Take 20 mg by mouth daily.  ? benazepril (LOTENSIN) 5 MG tablet Take 1 tablet by mouth  daily.  ? buPROPion (WELLBUTRIN SR) 100 MG 12 hr tablet Take 100 mg by mouth daily.  ? Cyanocobalamin 1000 MCG TBCR Take 1 tablet by mouth 2 (two) times daily.   ? tamsulosin (FLOMAX) 0.4 MG CAPS capsule Take 0.4 mg by mouth at bedtime.  ?  ? ?Allergies:   Patient has no known allergies.  ? ?Social History  ? ?Socioeconomic History  ? Marital status: Married  ?  Spouse name: Not on file  ? Number of children: Not on file  ? Years of education: Not on file  ? Highest education level: Not on file  ?Occupational History  ? Not on file  ?Tobacco Use  ? Smoking status: Former  ? Smokeless tobacco: Never  ?Substance and Sexual Activity  ? Alcohol use: Not on file  ? Drug use: Not on file  ? Sexual activity: Not on file  ?Other Topics Concern  ? Not on file  ?Social History Narrative  ? Not on file  ? ?Social Determinants of Health  ? ?Financial Resource Strain: Not on file  ?Food Insecurity: Not on file  ?Transportation Needs: Not on file  ?Physical Activity: Not on file  ?Stress: Not on file  ?Social Connections: Not on file  ?  ? ?Family History: ?The patient's family history includes Diabetes in his father; Heart disease in his father. ?ROS:   ?Please see the  history of present illness.    ?All 14 point review of systems negative except as described per history of present illness ? ?EKGs/Labs/Other Studies Reviewed:   ? ? ? ?Recent Labs: ?No results found for requested labs within last 8760 hours.  ?Recent Lipid Panel ?No results found for: CHOL, TRIG, HDL, CHOLHDL, VLDL, LDLCALC, LDLDIRECT ? ?Physical Exam:   ? ?VS:  BP 126/82 (BP Location: Right Arm, Patient Position: Sitting)   Pulse 63   Ht '6\' 1"'$  (1.854 m)   Wt 222 lb 6.4 oz (100.9 kg)   SpO2 98%   BMI 29.34 kg/m?    ? ?Wt Readings from Last 3 Encounters:  ?07/14/21 222 lb 6.4 oz (100.9 kg)  ?07/13/20 222 lb (100.7 kg)  ?01/16/20 217 lb 3.2 oz (98.5 kg)  ?  ? ?GEN:  Well nourished, well developed in no acute distress ?HEENT: Normal ?NECK: No JVD; No carotid  bruits ?LYMPHATICS: No lymphadenopathy ?CARDIAC: RRR, no murmurs, no rubs, no gallops ?RESPIRATORY:  Clear to auscultation without rales, wheezing or rhonchi  ?ABDOMEN: Soft, non-tender, non-distended ?MUSCULOSKELETAL:  No edema; No deformity  ?SKIN: Warm and dry ?LOWER EXTREMITIES: no swelling ?NEUROLOGIC:  Alert and oriented x 3 ?PSYCHIATRIC:  Normal affect  ? ?ASSESSMENT:   ? ?1. SVT (supraventricular tachycardia) ([place])   ?2. Nonsustained ventricular tachycardia   ?3. Primary hypertension   ?4. Dyslipidemia   ? ?PLAN:   ? ?In order of problems listed above: ? ?History of ventricular ectopy with nonsustained ventricular tachycardia echocardiogram showed preserved ejection fraction, no palpitations no dizziness no passing out.  He is EKG today show a lot of APCs.  I will ask him to wear a monitor for 1 week to make sure he does not have any significant arrhythmia of course we will look for potential ventricular tachycardia. ?Supraventricular tachycardia status post ablation 20 years ago doing well from that point review. ?Essential hypertension: Blood pressure seems to well controlled continue present management. ?Dyslipidemia I did review K PN which show me total cholesterol 159 HDL 62.  He is taking atorvastatin 20 which I will continue. ? ? ?Medication Adjustments/Labs and Tests Ordered: ?Current medicines are reviewed at length with the patient today.  Concerns regarding medicines are outlined above.  ?No orders of the defined types were placed in this encounter. ? ?Medication changes: No orders of the defined types were placed in this encounter. ? ? ?Signed, ?Park Liter, MD, [facility] ?07/14/2021 9:03 AM    ?[place] ?

## 2021-07-14 NOTE — Patient Instructions (Signed)
Medication Instructions:  ?Your physician recommends that you continue on your current medications as directed. Please refer to the Current Medication list given to you today. ? ?*If you need a refill on your cardiac medications before your next appointment, please call your pharmacy* ? ? ?Lab Work: ?NONE ?If you have labs (blood work) drawn today and your tests are completely normal, you will receive your results only by: ?MyChart Message (if you have MyChart) OR ?A paper copy in the mail ?If you have any lab test that is abnormal or we need to change your treatment, we will call you to review the results. ? ? ?Testing/Procedures: ?You have a Zio heart monitor to wear for 7 days. When done mail back to company in box provided. If any problems with monitor please call company. ? ? ?Follow-Up: ?At [facility], you and your health needs are our priority.  As part of our continuing mission to provide you with exceptional heart care, we have created designated Provider Care Teams.  These Care Teams include your primary Cardiologist (physician) and Advanced Practice Providers (APPs -  Physician Assistants and Nurse Practitioners) who all work together to provide you with the care you need, when you need it. ? ?We recommend signing up for the patient portal called "MyChart".  Sign up information is provided on this After Visit Summary.  MyChart is used to connect with patients for Virtual Visits (Telemedicine).  Patients are able to view lab/test results, encounter notes, upcoming appointments, etc.  Non-urgent messages can be sent to your provider as well.   ?To learn more about what you can do with MyChart, go to [email or web address].   ? ?Your next appointment:   ?1 year(s) ? ?The format for your next appointment:   ?In Person ? ?Provider:   ?Jenne Campus, MD  ? ? ?Other Instructions ?  ?

## 2021-07-14 NOTE — Addendum Note (Signed)
Addended by: Anselm Pancoast on: 07/14/2021 09:12 AM ? ? Modules accepted: Orders ? ?

## 2021-07-21 DIAGNOSIS — M79605 Pain in left leg: Secondary | ICD-10-CM | POA: Diagnosis not present

## 2021-07-21 DIAGNOSIS — I10 Essential (primary) hypertension: Secondary | ICD-10-CM | POA: Diagnosis not present

## 2021-07-21 DIAGNOSIS — R1032 Left lower quadrant pain: Secondary | ICD-10-CM | POA: Diagnosis not present

## 2021-07-22 DIAGNOSIS — N2889 Other specified disorders of kidney and ureter: Secondary | ICD-10-CM | POA: Diagnosis not present

## 2021-07-28 ENCOUNTER — Encounter (HOSPITAL_COMMUNITY): Payer: Self-pay | Admitting: Radiology

## 2021-07-28 DIAGNOSIS — I4729 Other ventricular tachycardia: Secondary | ICD-10-CM | POA: Diagnosis not present

## 2021-07-29 ENCOUNTER — Other Ambulatory Visit (HOSPITAL_BASED_OUTPATIENT_CLINIC_OR_DEPARTMENT_OTHER): Payer: Self-pay | Admitting: Family Medicine

## 2021-07-29 DIAGNOSIS — N289 Disorder of kidney and ureter, unspecified: Secondary | ICD-10-CM

## 2021-08-03 ENCOUNTER — Encounter: Payer: Self-pay | Admitting: Cardiology

## 2021-08-06 ENCOUNTER — Ambulatory Visit (HOSPITAL_BASED_OUTPATIENT_CLINIC_OR_DEPARTMENT_OTHER)
Admission: RE | Admit: 2021-08-06 | Discharge: 2021-08-06 | Disposition: A | Discharge status: Home / Self Care | Payer: PPO | Source: Ambulatory Visit | Attending: Family Medicine | Admitting: Family Medicine

## 2021-08-06 ENCOUNTER — Telehealth: Payer: Self-pay

## 2021-08-06 DIAGNOSIS — N289 Disorder of kidney and ureter, unspecified: Secondary | ICD-10-CM | POA: Diagnosis not present

## 2021-08-06 DIAGNOSIS — K5730 Diverticulosis of large intestine without perforation or abscess without bleeding: Secondary | ICD-10-CM | POA: Diagnosis not present

## 2021-08-06 MED ORDER — METOPROLOL TARTRATE 25 MG PO TABS
25.0000 mg | ORAL_TABLET | Freq: Two times a day (BID) | ORAL | 0 refills | Status: DC
Start: 2021-08-06 — End: 2022-01-19

## 2021-08-06 MED ORDER — GADOBUTROL 1 MMOL/ML IV SOLN
10.0000 mL | Freq: Once | INTRAVENOUS | Status: AC | PRN
  Administered 2021-08-06: 10 mL via INTRAVENOUS
  Filled 2021-08-06: qty 10

## 2021-08-06 NOTE — Telephone Encounter (Signed)
Rx sent to pharmacy per Dr. Wendy Poet notes. ?

## 2021-08-13 ENCOUNTER — Encounter: Payer: Self-pay | Admitting: Cardiology

## 2021-08-17 ENCOUNTER — Encounter: Payer: Self-pay | Admitting: Cardiology

## 2021-09-16 DIAGNOSIS — Z6829 Body mass index (BMI) 29.0-29.9, adult: Secondary | ICD-10-CM | POA: Diagnosis not present

## 2021-09-16 DIAGNOSIS — M1990 Unspecified osteoarthritis, unspecified site: Secondary | ICD-10-CM | POA: Diagnosis not present

## 2021-09-16 DIAGNOSIS — M25562 Pain in left knee: Secondary | ICD-10-CM | POA: Diagnosis not present

## 2021-09-16 DIAGNOSIS — E782 Mixed hyperlipidemia: Secondary | ICD-10-CM | POA: Diagnosis not present

## 2021-09-21 DIAGNOSIS — E7800 Pure hypercholesterolemia, unspecified: Secondary | ICD-10-CM | POA: Diagnosis not present

## 2021-09-22 DIAGNOSIS — I10 Essential (primary) hypertension: Secondary | ICD-10-CM | POA: Diagnosis not present

## 2021-09-22 DIAGNOSIS — F331 Major depressive disorder, recurrent, moderate: Secondary | ICD-10-CM | POA: Diagnosis not present

## 2021-09-22 DIAGNOSIS — E782 Mixed hyperlipidemia: Secondary | ICD-10-CM | POA: Diagnosis not present

## 2021-09-24 DIAGNOSIS — M25562 Pain in left knee: Secondary | ICD-10-CM | POA: Diagnosis not present

## 2021-11-09 DIAGNOSIS — M1712 Unilateral primary osteoarthritis, left knee: Secondary | ICD-10-CM | POA: Diagnosis not present

## 2021-11-23 DIAGNOSIS — D225 Melanocytic nevi of trunk: Secondary | ICD-10-CM | POA: Diagnosis not present

## 2021-11-23 DIAGNOSIS — L821 Other seborrheic keratosis: Secondary | ICD-10-CM | POA: Diagnosis not present

## 2021-11-23 DIAGNOSIS — L570 Actinic keratosis: Secondary | ICD-10-CM | POA: Diagnosis not present

## 2021-11-23 DIAGNOSIS — L308 Other specified dermatitis: Secondary | ICD-10-CM | POA: Diagnosis not present

## 2021-11-23 DIAGNOSIS — L814 Other melanin hyperpigmentation: Secondary | ICD-10-CM | POA: Diagnosis not present

## 2021-11-23 DIAGNOSIS — D2261 Melanocytic nevi of right upper limb, including shoulder: Secondary | ICD-10-CM | POA: Diagnosis not present

## 2021-11-23 DIAGNOSIS — D1801 Hemangioma of skin and subcutaneous tissue: Secondary | ICD-10-CM | POA: Diagnosis not present

## 2021-11-25 DIAGNOSIS — Q254 Congenital malformation of aorta unspecified: Secondary | ICD-10-CM | POA: Diagnosis not present

## 2021-11-25 DIAGNOSIS — I10 Essential (primary) hypertension: Secondary | ICD-10-CM | POA: Diagnosis not present

## 2021-11-25 DIAGNOSIS — Z8679 Personal history of other diseases of the circulatory system: Secondary | ICD-10-CM | POA: Diagnosis not present

## 2021-11-25 DIAGNOSIS — E782 Mixed hyperlipidemia: Secondary | ICD-10-CM | POA: Diagnosis not present

## 2021-11-25 DIAGNOSIS — F331 Major depressive disorder, recurrent, moderate: Secondary | ICD-10-CM | POA: Diagnosis not present

## 2021-11-25 DIAGNOSIS — Z0000 Encounter for general adult medical examination without abnormal findings: Secondary | ICD-10-CM | POA: Diagnosis not present

## 2021-11-25 DIAGNOSIS — I77810 Thoracic aortic ectasia: Secondary | ICD-10-CM | POA: Diagnosis not present

## 2021-11-25 DIAGNOSIS — N400 Enlarged prostate without lower urinary tract symptoms: Secondary | ICD-10-CM | POA: Diagnosis not present

## 2021-11-25 DIAGNOSIS — J302 Other seasonal allergic rhinitis: Secondary | ICD-10-CM | POA: Diagnosis not present

## 2021-11-25 DIAGNOSIS — D510 Vitamin B12 deficiency anemia due to intrinsic factor deficiency: Secondary | ICD-10-CM | POA: Diagnosis not present

## 2021-11-25 DIAGNOSIS — Z6828 Body mass index (BMI) 28.0-28.9, adult: Secondary | ICD-10-CM | POA: Diagnosis not present

## 2021-11-30 DIAGNOSIS — M1712 Unilateral primary osteoarthritis, left knee: Secondary | ICD-10-CM | POA: Diagnosis not present

## 2021-12-02 DIAGNOSIS — I714 Abdominal aortic aneurysm, without rupture, unspecified: Secondary | ICD-10-CM | POA: Diagnosis not present

## 2021-12-02 DIAGNOSIS — Q254 Congenital malformation of aorta unspecified: Secondary | ICD-10-CM | POA: Diagnosis not present

## 2021-12-07 DIAGNOSIS — M1712 Unilateral primary osteoarthritis, left knee: Secondary | ICD-10-CM | POA: Diagnosis not present

## 2021-12-14 DIAGNOSIS — M1712 Unilateral primary osteoarthritis, left knee: Secondary | ICD-10-CM | POA: Diagnosis not present

## 2022-01-19 ENCOUNTER — Other Ambulatory Visit: Payer: Self-pay | Admitting: Cardiology

## 2022-02-11 DIAGNOSIS — H25813 Combined forms of age-related cataract, bilateral: Secondary | ICD-10-CM | POA: Diagnosis not present

## 2022-04-25 HISTORY — PX: COLONOSCOPY: SHX174

## 2022-06-11 DIAGNOSIS — S61012A Laceration without foreign body of left thumb without damage to nail, initial encounter: Secondary | ICD-10-CM | POA: Diagnosis not present

## 2022-07-21 ENCOUNTER — Ambulatory Visit: Payer: PPO | Admitting: Cardiology

## 2022-08-05 ENCOUNTER — Ambulatory Visit: Payer: PPO | Attending: Cardiology | Admitting: Cardiology

## 2022-08-05 ENCOUNTER — Encounter: Payer: Self-pay | Admitting: Cardiology

## 2022-08-05 VITALS — BP 104/68 | HR 68 | Ht 73.0 in | Wt 221.0 lb

## 2022-08-05 DIAGNOSIS — I10 Essential (primary) hypertension: Secondary | ICD-10-CM

## 2022-08-05 DIAGNOSIS — I471 Supraventricular tachycardia, unspecified: Secondary | ICD-10-CM

## 2022-08-05 DIAGNOSIS — I4729 Other ventricular tachycardia: Secondary | ICD-10-CM | POA: Diagnosis not present

## 2022-08-05 DIAGNOSIS — E785 Hyperlipidemia, unspecified: Secondary | ICD-10-CM | POA: Diagnosis not present

## 2022-08-05 NOTE — Progress Notes (Signed)
Cardiology Office Note:    Date:  08/05/2022   ID:  Austin Drake, DOB May 24, 1950, MRN [id number]  PCP:  Noni Saupe, MD  Cardiologist:  Gypsy Balsam, MD    Referring MD: Noni Saupe, MD   No chief complaint on file. Doing fine  History of Present Illness:    Austin Drake is a 72 y.o. male past medical history significant for supraventricular tachycardia, status post SVT ablation done more than 20 years ago, ventricular tachycardia, ventricular ectopy, essential hypertension, dyslipidemia.  Comes today to months for follow-up.  Initially we will put him on 25 mg of metoprolol twice daily.  He felt absolutely awful dizzy lightheaded not having energy he decided to cut down by half and with half he is feeling much better still somewhat weak and tired but much better overall.  Still exercise on the regular basis he played golf he walks while playing golf 18 holes, he also goes to gym today that he does not play golf and exercise aggressively doing aerobic exercise weightlifting and have no difficulty doing it.  No chest pain tightness squeezing pressure burning chest.  Past Medical History:  Diagnosis Date   Acquired plantar porokeratosis 06/23/2020   Depression 11/21/2017   Dyslipidemia 11/21/2017   Hypertension 11/21/2017   Knee joint pain 07/26/2012   Nonsustained ventricular tachycardia 01/03/2018   SVT (supraventricular tachycardia) 11/21/2017   Status post ablation in 2003 done by Dr. Joselyn Glassman    Past Surgical History:  Procedure Laterality Date   COLONOSCOPY  2001   COLONOSCOPY  2009   COLONOSCOPY  2014   COLONOSCOPY  2019   HERNIA REPAIR     HERNIA REPAIR  2019   KIDNEY STONE SURGERY  2002   LASIK  2002   LITHOTRIPSY  2007    Current Medications: Current Meds  Medication Sig   aspirin EC 81 MG tablet Take 1 tablet by mouth daily.   atorvastatin (LIPITOR) 20 MG tablet Take 20 mg by mouth daily.   benazepril (LOTENSIN) 5 MG tablet Take 1 tablet by  mouth daily.   buPROPion (WELLBUTRIN SR) 150 MG 12 hr tablet Take 150 mg by mouth daily.   Cyanocobalamin 1000 MCG TBCR Take 1 tablet by mouth 2 (two) times daily.    metoprolol tartrate (LOPRESSOR) 25 MG tablet Take 1 tablet (25 mg total) by mouth 2 (two) times daily. (Patient taking differently: Take 12.5 mg by mouth 2 (two) times daily. Takes 0.5 tablet (12.5 mg) twice daily)   tamsulosin (FLOMAX) 0.4 MG CAPS capsule Take 0.4 mg by mouth at bedtime.     Allergies:   Patient has no known allergies.   Social History   Socioeconomic History   Marital status: Married    Spouse name: Not on file   Number of children: Not on file   Years of education: Not on file   Highest education level: Not on file  Occupational History   Not on file  Tobacco Use   Smoking status: Former   Smokeless tobacco: Never  Substance and Sexual Activity   Alcohol use: Not on file   Drug use: Not on file   Sexual activity: Not on file  Other Topics Concern   Not on file  Social History Narrative   Not on file   Social Determinants of Health   Financial Resource Strain: Not on file  Food Insecurity: Not on file  Transportation Needs: Not on file  Physical Activity: Not on  file  Stress: Not on file  Social Connections: Not on file     Family History: The patient's family history includes Diabetes in his father; Heart disease in his father. ROS:   Please see the history of present illness.    All 14 point review of systems negative except as described per history of present illness  EKGs/Labs/Other Studies Reviewed:      Recent Labs: No results found for requested labs within last 365 days.  Recent Lipid Panel No results found for: "CHOL", "TRIG", "HDL", "CHOLHDL", "VLDL", "LDLCALC", "LDLDIRECT"  Physical Exam:    VS:  BP 104/68 (BP Location: Left Arm, Patient Position: Sitting)   Pulse 68   Ht 6\' 1"  (1.854 m)   Wt 221 lb (100.2 kg)   SpO2 97%   BMI 29.16 kg/m     Wt Readings from  Last 3 Encounters:  08/05/22 221 lb (100.2 kg)  07/14/21 222 lb 6.4 oz (100.9 kg)  07/13/20 222 lb (100.7 kg)     GEN:  Well nourished, well developed in no acute distress HEENT: Normal NECK: No JVD; No carotid bruits LYMPHATICS: No lymphadenopathy CARDIAC: RRR, no murmurs, no rubs, no gallops RESPIRATORY:  Clear to auscultation without rales, wheezing or rhonchi  ABDOMEN: Soft, non-tender, non-distended MUSCULOSKELETAL:  No edema; No deformity  SKIN: Warm and dry LOWER EXTREMITIES: no swelling NEUROLOGIC:  Alert and oriented x 3 PSYCHIATRIC:  Normal affect   ASSESSMENT:    1. Nonsustained ventricular tachycardia   2. SVT (supraventricular tachycardia)   3. Primary hypertension   4. Dyslipidemia    PLAN:    In order of problems listed above:  History of nonsustained ventricular tachycardia no dizziness no passing out small dose of beta-blocker with difficulty tolerating it.  Asking to stop mental profile for about a week or 2 and see if he feels any better if that is the case we probably will initiate calcium channel blocker, if he does not feel any different we will put him back however on long-acting form of metoprolol he will be on metoprolol succinate 25 mg daily. History of supraventricular tachycardia stable. Essential hypertension blood pressure well-controlled continue present management. Dyslipidemia I do not have his latest fasting lipid profile last 1 I have is from May 2023 I did review K PN for it this.  Cholesterol total 142 HDL 57.  He is going to have a visit for annual with his primary care physician in May and he will have fasting lipid profile done then we will check weight for the   Medication Adjustments/Labs and Tests Ordered: Current medicines are reviewed at length with the patient today.  Concerns regarding medicines are outlined above.  No orders of the defined types were placed in this encounter.  Medication changes: No orders of the defined types  were placed in this encounter.   Signed, Georgeanna Lea, MD, [facility] 08/05/2022 9:22 AM    [facility]

## 2022-08-05 NOTE — Patient Instructions (Signed)
Medication Instructions:  Your physician recommends that you continue on your current medications as directed. Please refer to the Current Medication list given to you today.  *If you need a refill on your cardiac medications before your next appointment, please call your pharmacy*   Lab Work: NONE If you have labs (blood work) drawn today and your tests are completely normal, you will receive your results only by: MyChart Message (if you have MyChart) OR A paper copy in the mail If you have any lab test that is abnormal or we need to change your treatment, we will call you to review the results.   Testing/Procedures: NONE   Follow-Up: At  [facility], you and your health needs are our priority.  As part of our continuing mission to provide you with exceptional heart care, we have created designated Provider Care Teams.  These Care Teams include your primary Cardiologist (physician) and Advanced Practice Providers (APPs -  Physician Assistants and Nurse Practitioners) who all work together to provide you with the care you need, when you need it.  We recommend signing up for the patient portal called "MyChart".  Sign up information is provided on this After Visit Summary.  MyChart is used to connect with patients for Virtual Visits (Telemedicine).  Patients are able to view lab/test results, encounter notes, upcoming appointments, etc.  Non-urgent messages can be sent to your provider as well.   To learn more about what you can do with MyChart, go to [email or web address].    Your next appointment:   6 month(s)  Provider:   Ibraham Krasowski, MD    Other Instructions   

## 2022-08-19 ENCOUNTER — Encounter: Payer: Self-pay | Admitting: Cardiology

## 2022-08-24 DIAGNOSIS — C44212 Basal cell carcinoma of skin of right ear and external auricular canal: Secondary | ICD-10-CM | POA: Diagnosis not present

## 2022-09-13 ENCOUNTER — Encounter: Payer: Self-pay | Admitting: Cardiology

## 2022-09-26 DIAGNOSIS — C44212 Basal cell carcinoma of skin of right ear and external auricular canal: Secondary | ICD-10-CM | POA: Diagnosis not present

## 2022-09-26 DIAGNOSIS — Z85828 Personal history of other malignant neoplasm of skin: Secondary | ICD-10-CM | POA: Diagnosis not present

## 2022-11-03 DIAGNOSIS — Z125 Encounter for screening for malignant neoplasm of prostate: Secondary | ICD-10-CM | POA: Diagnosis not present

## 2022-11-03 DIAGNOSIS — Z136 Encounter for screening for cardiovascular disorders: Secondary | ICD-10-CM | POA: Diagnosis not present

## 2022-11-03 DIAGNOSIS — Z0000 Encounter for general adult medical examination without abnormal findings: Secondary | ICD-10-CM | POA: Diagnosis not present

## 2022-12-16 DIAGNOSIS — L821 Other seborrheic keratosis: Secondary | ICD-10-CM | POA: Diagnosis not present

## 2022-12-16 DIAGNOSIS — Z85828 Personal history of other malignant neoplasm of skin: Secondary | ICD-10-CM | POA: Diagnosis not present

## 2022-12-16 DIAGNOSIS — L309 Dermatitis, unspecified: Secondary | ICD-10-CM | POA: Diagnosis not present

## 2022-12-16 DIAGNOSIS — L218 Other seborrheic dermatitis: Secondary | ICD-10-CM | POA: Diagnosis not present

## 2022-12-16 DIAGNOSIS — D1801 Hemangioma of skin and subcutaneous tissue: Secondary | ICD-10-CM | POA: Diagnosis not present

## 2022-12-16 DIAGNOSIS — C44319 Basal cell carcinoma of skin of other parts of face: Secondary | ICD-10-CM | POA: Diagnosis not present

## 2022-12-16 DIAGNOSIS — L814 Other melanin hyperpigmentation: Secondary | ICD-10-CM | POA: Diagnosis not present

## 2022-12-16 DIAGNOSIS — C4441 Basal cell carcinoma of skin of scalp and neck: Secondary | ICD-10-CM | POA: Diagnosis not present

## 2022-12-19 IMAGING — MR MR ABDOMEN WO/W CM
16 of 17 series · 44 of 48 positions shown · IV contrast (GADAVIST)
Comparison: CT abdomen pelvis, 07/01/2021 renal ultrasound,
07/22/2021

CLINICAL DATA: Characterize indeterminate left renal lesions
incidentally identified by prior CT

EXAM:
MRI ABDOMEN WITHOUT AND WITH CONTRAST
TECHNIQUE: Multiplanar multisequence MR imaging of the abdomen was performed
both before and after the administration of intravenous contrast.
CONTRAST:  10mL GADAVIST GADOBUTROL 1 MMOL/ML IV SOLN

[Series 8: cor ssfse / · coronal · 7.0mm · 1.64mm/px · 1 of 36 slices shown]
[im 1/36]
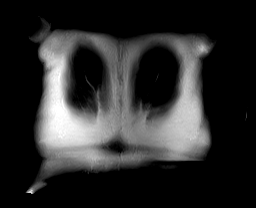

[Series 9: T2 fat-sat · axial · 7.0mm · 1.64mm/px · 1 of 32 slices shown]
[im 1/32]
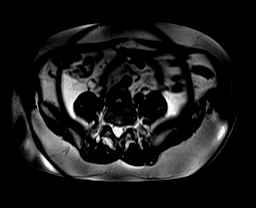

[Series 10: T1 · axial · 7.0mm · 0.82mm/px · z∈[-172,+88]mm · 3 of 64 slices shown]
[im 1/64]
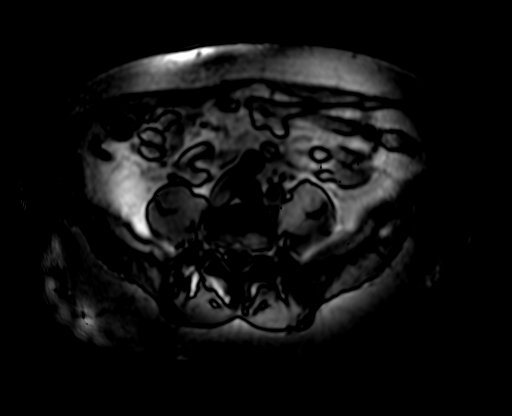
[im 32/64]
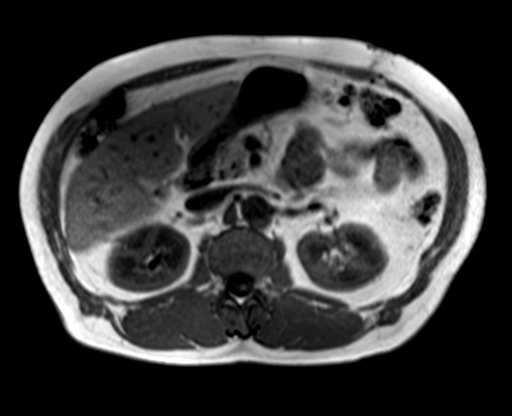
[im 64/64]
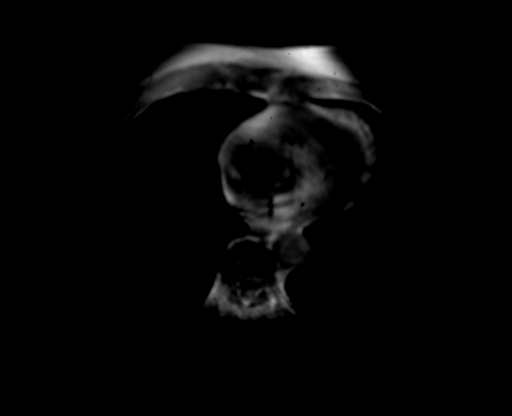

[Series 12: DWI · axial · 6.0mm · 2.19mm/px · z∈[-145,+136]mm · 6 of 120 slices shown (1 of 2)]
[im 1/120]
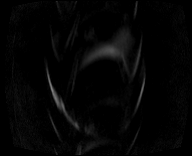
[im 24/120]
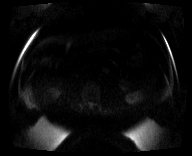
[im 48/120]
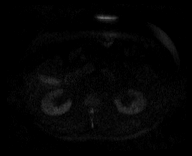
[im 72/120]
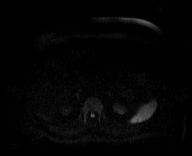
[im 96/120]
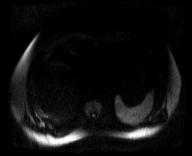
[im 120/120]
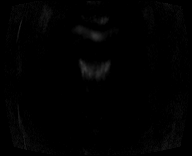

[Series 13: DWI · axial · 6.0mm · 2.19mm/px · z∈[-145,+136]mm · 2 of 40 slices shown (2 of 2)]
[im 1/40]
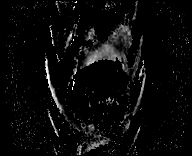
[im 40/40]
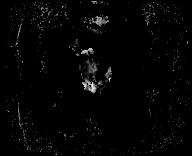

[Series 14: bSSFP · axial · 7.0mm · 0.82mm/px · z∈[-183,+94]mm · 2 of 34 slices shown]
[im 1/34]
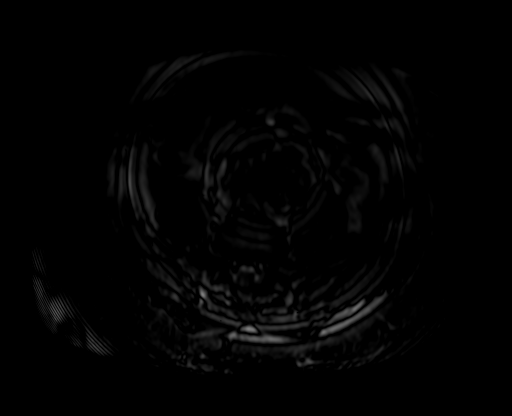
[im 34/34]
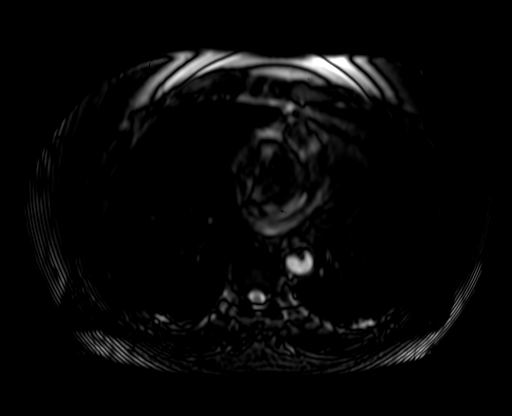

[Series 15: axial ssfse / · axial · 6.1mm · 1.31mm/px · z∈[-173,+83]mm · 2 of 36 slices shown]
[im 1/36]
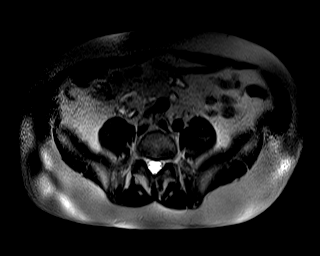
[im 36/36]
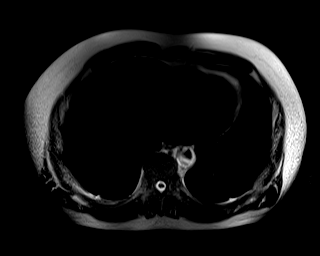

[Series 16: axial dynamic pre · axial · non-contrast · 5.0mm · 1.46mm/px · z∈[-192,+103]mm · 3 of 60 slices shown]
[im 1/60]
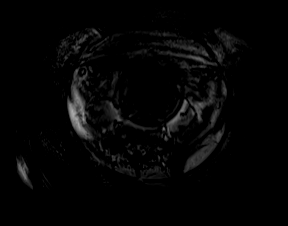
[im 30/60]
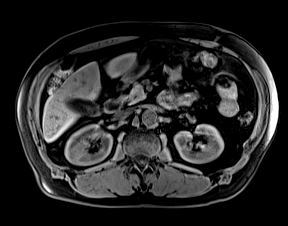
[im 60/60]
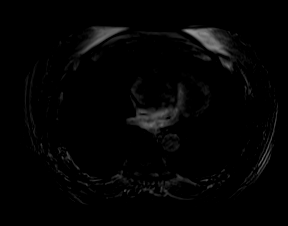

[Series 17: axial dynamic post · axial · 5.0mm · 1.46mm/px · z∈[-192,+103]mm · 3 of 60 slices shown (1 of 6)]
[im 1/60]
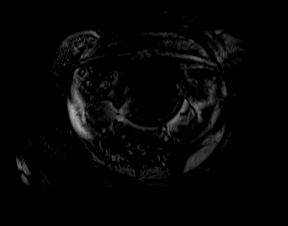
[im 30/60]
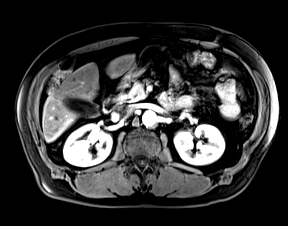
[im 60/60]
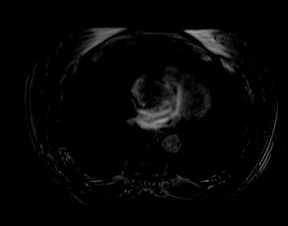

[Series 18: axial dynamic post · axial · 5.0mm · 1.46mm/px · z∈[-192,+103]mm · 3 of 60 slices shown (2 of 6)]
[im 1/60]
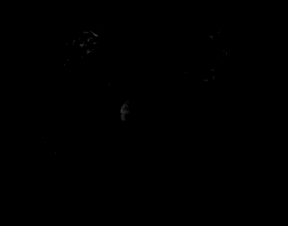
[im 30/60]
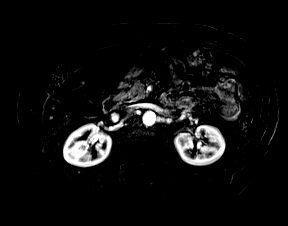
[im 60/60]
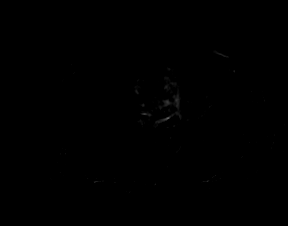

[Series 19: axial dynamic post · axial · 5.0mm · 1.46mm/px · z∈[-192,+103]mm · 3 of 60 slices shown (3 of 6)]
[im 1/60]
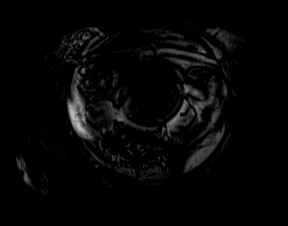
[im 30/60]
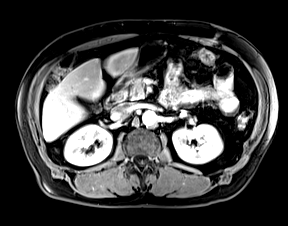
[im 60/60]
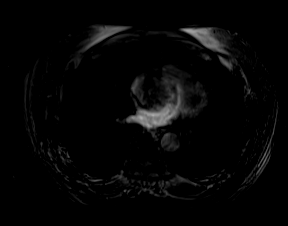

[Series 20: axial dynamic post · axial · 5.0mm · 1.46mm/px · z∈[-192,+103]mm · 3 of 60 slices shown (4 of 6)]
[im 1/60]
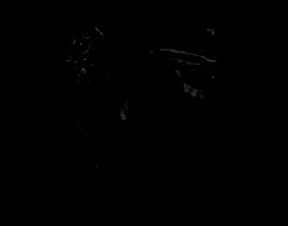
[im 30/60]
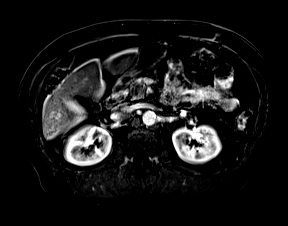
[im 60/60]
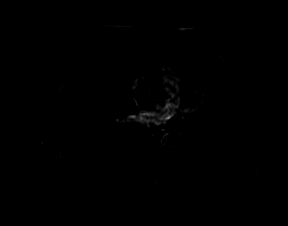

[Series 21: axial dynamic post · axial · 5.0mm · 1.46mm/px · z∈[-192,+103]mm · 3 of 60 slices shown (5 of 6)]
[im 1/60]
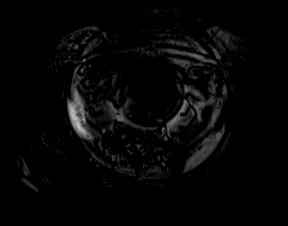
[im 30/60]
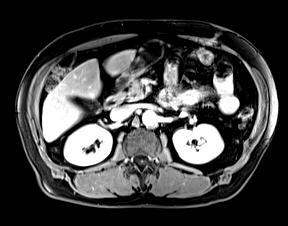
[im 60/60]
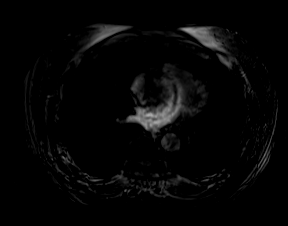

[Series 22: axial dynamic post · axial · 5.0mm · 1.46mm/px · z∈[-192,+103]mm · 3 of 60 slices shown (6 of 6)]
[im 1/60]
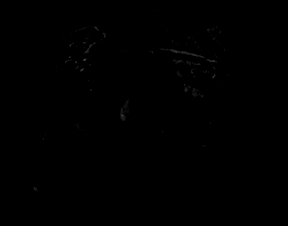
[im 30/60]
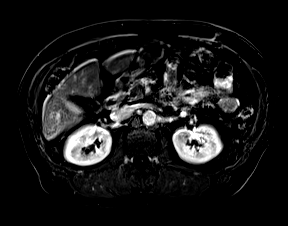
[im 60/60]
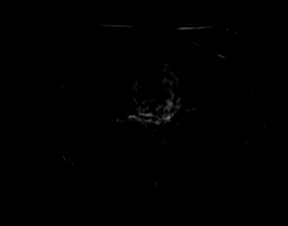

[Series 24: axial dynamic 3 · axial · 5.0mm · 1.46mm/px · z∈[-192,+103]mm · 3 of 60 slices shown (1 of 2)]
[im 1/60]
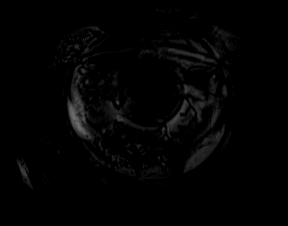
[im 30/60]
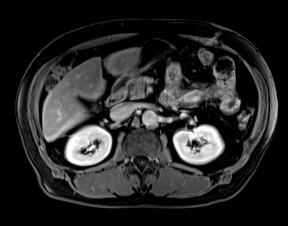
[im 60/60]
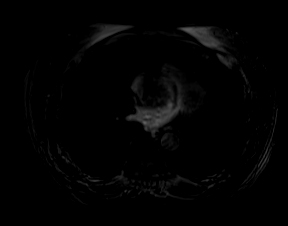

[Series 25: axial dynamic 3 · axial · 5.0mm · 1.46mm/px · z∈[-192,+103]mm · 3 of 60 slices shown (2 of 2)]
[im 1/60]
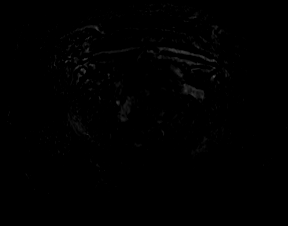
[im 30/60]
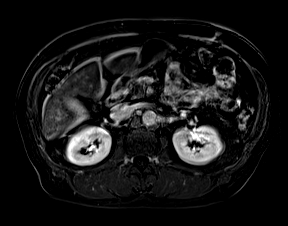
[im 60/60]
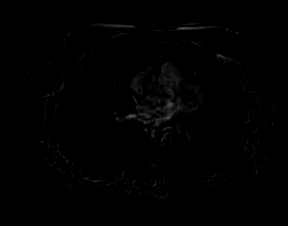

[44 of 48 positions shown; findings below may reference images not displayed]

FINDINGS: Lower chest: No acute findings.

Hepatobiliary: No mass or other parenchymal abnormality identified.
No gallstones. No biliary ductal dilatation.

Pancreas: No mass, inflammatory changes, or other parenchymal
abnormality identified.No pancreatic ductal dilatation.

Spleen:  Within normal limits in size and appearance.

Adrenals/Urinary Tract: Normal adrenal glands. Intrinsically T1
hyperintense, T2 intermediate partially exophytic lesion of the
posterior midportion of the left kidney, measuring 1.6 cm (series
16, image 37). Assessment for contrast enhancement is somewhat
limited by vertical misregistration of subtraction sequences,
however within this limitation there does not appear to be
associated contrast enhancement. No evidence associated contrast
enhancement associated with a small partially exophytic lesion of
the anterior midportion of the left kidney, previously shown to be
calcified by CT (series 16, image 43). No renal masses or suspicious
contrast enhancement identified. No evidence of hydronephrosis.

Stomach/Bowel: Descending and sigmoid diverticulosis. Visualized
portions within the abdomen are of the unremarkable.

Vascular/Lymphatic: No pathologically enlarged lymph nodes
identified. No abdominal aortic aneurysm demonstrated.

Other:  None.

Musculoskeletal: No suspicious osseous lesions identified.
IMPRESSION: 1. Intrinsically T1 hyperintense, T2 intermediate partially
exophytic lesion of the posterior midportion of the left kidney,
measuring 1.6 cm. Assessment for contrast enhancement is somewhat
limited by vertical misregistration of subtraction sequences,
however within this limitation there does not appear to be
associated contrast enhancement. Findings are consistent with a
benign hemorrhagic or proteinaceous cyst, for which no further
follow-up or characterization is required.
2. No evidence of solid renal masses or suspicious contrast
enhancement.
3. Descending and sigmoid diverticulosis.

## 2022-12-28 DIAGNOSIS — Z86010 Personal history of colonic polyps: Secondary | ICD-10-CM | POA: Diagnosis not present

## 2022-12-28 DIAGNOSIS — K649 Unspecified hemorrhoids: Secondary | ICD-10-CM | POA: Diagnosis not present

## 2023-02-01 DIAGNOSIS — I10 Essential (primary) hypertension: Secondary | ICD-10-CM | POA: Diagnosis not present

## 2023-02-01 DIAGNOSIS — Z860109 Personal history of other colon polyps: Secondary | ICD-10-CM | POA: Diagnosis not present

## 2023-02-01 DIAGNOSIS — K5730 Diverticulosis of large intestine without perforation or abscess without bleeding: Secondary | ICD-10-CM | POA: Diagnosis not present

## 2023-02-01 DIAGNOSIS — K648 Other hemorrhoids: Secondary | ICD-10-CM | POA: Diagnosis not present

## 2023-02-01 DIAGNOSIS — Z09 Encounter for follow-up examination after completed treatment for conditions other than malignant neoplasm: Secondary | ICD-10-CM | POA: Diagnosis not present

## 2023-02-01 DIAGNOSIS — Z860100 Personal history of colon polyps, unspecified: Secondary | ICD-10-CM | POA: Diagnosis not present

## 2023-02-01 DIAGNOSIS — Z1211 Encounter for screening for malignant neoplasm of colon: Secondary | ICD-10-CM | POA: Diagnosis not present

## 2023-02-13 ENCOUNTER — Ambulatory Visit: Payer: PPO | Attending: Cardiology | Admitting: Cardiology

## 2023-02-13 ENCOUNTER — Encounter: Payer: Self-pay | Admitting: Cardiology

## 2023-02-13 VITALS — BP 128/68 | HR 71 | Ht 73.0 in | Wt 222.6 lb

## 2023-02-13 DIAGNOSIS — I4729 Other ventricular tachycardia: Secondary | ICD-10-CM

## 2023-02-13 DIAGNOSIS — E785 Hyperlipidemia, unspecified: Secondary | ICD-10-CM

## 2023-02-13 DIAGNOSIS — I10 Essential (primary) hypertension: Secondary | ICD-10-CM

## 2023-02-13 DIAGNOSIS — I471 Supraventricular tachycardia, unspecified: Secondary | ICD-10-CM

## 2023-02-13 DIAGNOSIS — R0609 Other forms of dyspnea: Secondary | ICD-10-CM

## 2023-02-13 NOTE — Patient Instructions (Signed)
Medication Instructions:  Your physician recommends that you continue on your current medications as directed. Please refer to the Current Medication list given to you today.  *If you need a refill on your cardiac medications before your next appointment, please call your pharmacy*   Lab Work: None Ordered If you have labs (blood work) drawn today and your tests are completely normal, you will receive your results only by: MyChart Message (if you have MyChart) OR A paper copy in the mail If you have any lab test that is abnormal or we need to change your treatment, we will call you to review the results.   Testing/Procedures: Your physician has requested that you have an echocardiogram. Echocardiography is a painless test that uses sound waves to create images of your heart. It provides your doctor with information about the size and shape of your heart and how well your heart's chambers and valves are working. This procedure takes approximately one hour. There are no restrictions for this procedure. Please do NOT wear cologne, perfume, aftershave, or lotions (deodorant is allowed). Please arrive 15 minutes prior to your appointment time.      Follow-Up: At [facility], you and your health needs are our priority.  As part of our continuing mission to provide you with exceptional heart care, we have created designated Provider Care Teams.  These Care Teams include your primary Cardiologist (physician) and Advanced Practice Providers (APPs -  Physician Assistants and Nurse Practitioners) who all work together to provide you with the care you need, when you need it.  We recommend signing up for the patient portal called "MyChart".  Sign up information is provided on this After Visit Summary.  MyChart is used to connect with patients for Virtual Visits (Telemedicine).  Patients are able to view lab/test results, encounter notes, upcoming appointments, etc.  Non-urgent messages can be sent to  your provider as well.   To learn more about what you can do with MyChart, go to [email or web address].    Your next appointment:   6 month(s)  The format for your next appointment:   In Person  Provider:   Merdith Krasowski, MD    Other Instructions NA  

## 2023-02-13 NOTE — Progress Notes (Signed)
Cardiology Office Note:    Date:  02/13/2023   ID:  Austin Drake, DOB Dec 16, 1950, MRN [id number]  PCP:  Noni Saupe, MD  Cardiologist:  Gypsy Balsam, MD    Referring MD: Noni Saupe, MD   Chief Complaint  Patient presents with   Follow-up    History of Present Illness:    Austin Drake is a 72 y.o. male with past medical history significant for supraventricular tachycardia status post SVT ablation done more than 20 years ago, ventricular tachycardia, ventricular ectopy, essential hypertension, dyslipidemia.  Comes today to months for follow-up I put him on beta-blocker for his PVCs and nonsustained ventricular tachycardia he felt absolutely miserable he stopped all medications meaning beta-blocker and he is feeling much better he is able to play golf.  No dizziness no passing out no palpitation no chest pain he is doing overall very well  Past Medical History:  Diagnosis Date   Acquired plantar porokeratosis 06/23/2020   Depression 11/21/2017   Dyslipidemia 11/21/2017   Hypertension 11/21/2017   Knee joint pain 07/26/2012   Nonsustained ventricular tachycardia (HCC) 01/03/2018   SVT (supraventricular tachycardia) (HCC) 11/21/2017   Status post ablation in 2003 done by Dr. Joselyn Glassman    Past Surgical History:  Procedure Laterality Date   COLONOSCOPY  2001   COLONOSCOPY  2009   COLONOSCOPY  2014   COLONOSCOPY  2019   COLONOSCOPY  2024   HERNIA REPAIR     HERNIA REPAIR  2019   KIDNEY STONE SURGERY  2002   LASIK  2002   LITHOTRIPSY  2007    Current Medications: Current Meds  Medication Sig   aspirin EC 81 MG tablet Take 1 tablet by mouth daily.   atorvastatin (LIPITOR) 20 MG tablet Take 20 mg by mouth daily.   benazepril (LOTENSIN) 5 MG tablet Take 1 tablet by mouth daily.   buPROPion (WELLBUTRIN SR) 150 MG 12 hr tablet Take 150 mg by mouth daily.   Cyanocobalamin 1000 MCG TBCR Take 1 tablet by mouth 2 (two) times daily.    tamsulosin (FLOMAX) 0.4 MG  CAPS capsule Take 0.4 mg by mouth at bedtime.   [DISCONTINUED] metoprolol tartrate (LOPRESSOR) 25 MG tablet Take 1 tablet (25 mg total) by mouth 2 (two) times daily. (Patient taking differently: Take 12.5 mg by mouth 2 (two) times daily. Takes 0.5 tablet (12.5 mg) twice daily)     Allergies:   Patient has no known allergies.   Social History   Socioeconomic History   Marital status: Married    Spouse name: Not on file   Number of children: Not on file   Years of education: Not on file   Highest education level: Not on file  Occupational History   Not on file  Tobacco Use   Smoking status: Former   Smokeless tobacco: Never  Substance and Sexual Activity   Alcohol use: Not on file   Drug use: Not on file   Sexual activity: Not on file  Other Topics Concern   Not on file  Social History Narrative   Not on file   Social Determinants of Health   Financial Resource Strain: Not on file  Food Insecurity: Not on file  Transportation Needs: Not on file  Physical Activity: Not on file  Stress: Not on file  Social Connections: Not on file     Family History: The patient's family history includes Diabetes in his father; Heart disease in his father. ROS:  Please see the history of present illness.    All 14 point review of systems negative except as described per history of present illness  EKGs/Labs/Other Studies Reviewed:         Recent Labs: No results found for requested labs within last 365 days.  Recent Lipid Panel No results found for: "CHOL", "TRIG", "HDL", "CHOLHDL", "VLDL", "LDLCALC", "LDLDIRECT"  Physical Exam:    VS:  BP 128/68 (BP Location: Right Arm, Patient Position: Sitting)   Pulse 71   Ht 6\' 1"  (1.854 m)   Wt 222 lb 9.6 oz (101 kg)   SpO2 95%   BMI 29.37 kg/m     Wt Readings from Last 3 Encounters:  02/13/23 222 lb 9.6 oz (101 kg)  08/05/22 221 lb (100.2 kg)  07/14/21 222 lb 6.4 oz (100.9 kg)     GEN:  Well nourished, well developed in no  acute distress HEENT: Normal NECK: No JVD; No carotid bruits LYMPHATICS: No lymphadenopathy CARDIAC: RRR, no murmurs, no rubs, no gallops RESPIRATORY:  Clear to auscultation without rales, wheezing or rhonchi  ABDOMEN: Soft, non-tender, non-distended MUSCULOSKELETAL:  No edema; No deformity  SKIN: Warm and dry LOWER EXTREMITIES: no swelling NEUROLOGIC:  Alert and oriented x 3 PSYCHIATRIC:  Normal affect   ASSESSMENT:    1. Nonsustained ventricular tachycardia (HCC)   2. SVT (supraventricular tachycardia) (HCC)   3. Primary hypertension   4. Dyslipidemia    PLAN:    In order of problems listed above:  Nonsustained ventricular tachycardia PVCs, try beta-blocker unsuccessful.  Now without anything asymptomatic we will do echocardiogram to check left ventricle ejection fraction of left ventricle ejection fraction is normal then we continue if not we will consider putting another monitor on and try different medications. History of SVT denies have any palpitations. Essential hypertension blood pressure well-controlled continue present management. Dyslipidemia I did review K PN which show me LDL 64 HDL 58 continue present management   Medication Adjustments/Labs and Tests Ordered: Current medicines are reviewed at length with the patient today.  Concerns regarding medicines are outlined above.  No orders of the defined types were placed in this encounter.  Medication changes: No orders of the defined types were placed in this encounter.   Signed, Georgeanna Lea, MD, [facility] 02/13/2023 10:35 AM    [facility]

## 2023-02-13 NOTE — Addendum Note (Signed)
Addended by: Baldo Ash D on: 02/13/2023 10:40 AM   Modules accepted: Orders

## 2023-02-17 DIAGNOSIS — H25813 Combined forms of age-related cataract, bilateral: Secondary | ICD-10-CM | POA: Diagnosis not present

## 2023-03-06 DIAGNOSIS — R0981 Nasal congestion: Secondary | ICD-10-CM | POA: Diagnosis not present

## 2023-03-06 DIAGNOSIS — R051 Acute cough: Secondary | ICD-10-CM | POA: Diagnosis not present

## 2023-03-16 ENCOUNTER — Ambulatory Visit: Payer: PPO | Attending: Cardiology

## 2023-03-16 DIAGNOSIS — R0609 Other forms of dyspnea: Secondary | ICD-10-CM

## 2023-03-16 LAB — ECHOCARDIOGRAM COMPLETE
Area-P 1/2: 2.97 cm2
S' Lateral: 3.1 cm

## 2023-03-22 ENCOUNTER — Telehealth: Payer: Self-pay

## 2023-03-22 ENCOUNTER — Encounter: Payer: Self-pay | Admitting: Cardiology

## 2023-03-22 NOTE — Telephone Encounter (Signed)
Left message on My Chart with normal Echo results per Dr. Krasowski's note. Routed to PCP.  

## 2023-03-27 ENCOUNTER — Telehealth: Payer: Self-pay

## 2023-03-27 NOTE — Telephone Encounter (Signed)
Pt viewed results on My Chart per Dr. Krasowski's note. Routed to PCP.  

## 2023-08-15 ENCOUNTER — Ambulatory Visit: Payer: PPO | Attending: Cardiology | Admitting: Cardiology

## 2023-08-15 ENCOUNTER — Encounter: Payer: Self-pay | Admitting: Cardiology

## 2023-08-15 VITALS — BP 126/80 | HR 60 | Ht 73.0 in | Wt 225.8 lb

## 2023-08-15 DIAGNOSIS — E785 Hyperlipidemia, unspecified: Secondary | ICD-10-CM

## 2023-08-15 DIAGNOSIS — I471 Supraventricular tachycardia, unspecified: Secondary | ICD-10-CM

## 2023-08-15 DIAGNOSIS — I4729 Other ventricular tachycardia: Secondary | ICD-10-CM | POA: Diagnosis not present

## 2023-08-15 DIAGNOSIS — I10 Essential (primary) hypertension: Secondary | ICD-10-CM

## 2023-08-15 NOTE — Progress Notes (Signed)
 Cardiology Office Note:    Date:  08/15/2023   ID:  Austin Drake, DOB 04-Mar-1951, MRN [id number]  PCP:  Russell Court, DO  Cardiologist:  Ralene Burger, MD    Referring MD: Madelyne Schiff, MD   Chief Complaint  Patient presents with   Follow-up    History of Present Illness:    Austin Drake is a 73 y.o. male past medical history significant for supraventricular tachycardia, status post SVT ablation done more than 25 years ago, ventricular tachycardia, ventricular ectopy, essential hypertension, dyslipidemia, preserved ejection fraction. Comes today to months for follow-up he is doing great.  He is asymptomatic he can play golf with no difficulties.  No palpitations no dizziness no swelling of lower extremities no passing out  Past Medical History:  Diagnosis Date   Acquired plantar porokeratosis 06/23/2020   Depression 11/21/2017   Dyslipidemia 11/21/2017   Hypertension 11/21/2017   Knee joint pain 07/26/2012   Nonsustained ventricular tachycardia (HCC) 01/03/2018   SVT (supraventricular tachycardia) (HCC) 11/21/2017   Status post ablation in 2003 done by Dr. Herminia Lope    Past Surgical History:  Procedure Laterality Date   COLONOSCOPY  2001   COLONOSCOPY  2009   COLONOSCOPY  2014   COLONOSCOPY  2019   COLONOSCOPY  2024   HERNIA REPAIR     HERNIA REPAIR  2019   KIDNEY STONE SURGERY  2002   LASIK  2002   LITHOTRIPSY  2007    Current Medications: Current Meds  Medication Sig   aspirin EC 81 MG tablet Take 1 tablet by mouth daily.   atorvastatin (LIPITOR) 20 MG tablet Take 20 mg by mouth daily.   benazepril (LOTENSIN) 5 MG tablet Take 1 tablet by mouth daily.   buPROPion (WELLBUTRIN SR) 150 MG 12 hr tablet Take 150 mg by mouth daily.   Cyanocobalamin 1000 MCG TBCR Take 1 tablet by mouth 2 (two) times daily.    tamsulosin (FLOMAX) 0.4 MG CAPS capsule Take 0.4 mg by mouth at bedtime.     Allergies:   Patient has no known allergies.   Social History    Socioeconomic History   Marital status: Married    Spouse name: Not on file   Number of children: Not on file   Years of education: Not on file   Highest education level: Not on file  Occupational History   Not on file  Tobacco Use   Smoking status: Former   Smokeless tobacco: Never  Substance and Sexual Activity   Alcohol use: Not on file   Drug use: Not on file   Sexual activity: Not on file  Other Topics Concern   Not on file  Social History Narrative   Not on file   Social Drivers of Health   Financial Resource Strain: Not on file  Food Insecurity: Not on file  Transportation Needs: Not on file  Physical Activity: Not on file  Stress: Not on file  Social Connections: Not on file     Family History: The patient's family history includes Diabetes in his father; Heart disease in his father. ROS:   Please see the history of present illness.    All 14 point review of systems negative except as described per history of present illness  EKGs/Labs/Other Studies Reviewed:    EKG Interpretation Date/Time:  Tuesday August 15 2023 11:12:13 EDT Ventricular Rate:  60 PR Interval:  352 QRS Duration:  110 QT Interval:  386 QTC Calculation: 386 R  Axis:   -73  Text Interpretation: Sinus rhythm with 1st degree A-V block Left axis deviation Abnormal ECG When compared with ECG of 02-Aug-2001 02:54, Premature supraventricular complexes are no longer Present PR interval has increased Vent. rate has decreased BY  29 BPM QRS axis Shifted left Confirmed by Ralene Burger [phone]) on 08/15/2023 11:30:55 AM    Recent Labs: No results found for requested labs within last 365 days.  Recent Lipid Panel No results found for: "CHOL", "TRIG", "HDL", "CHOLHDL", "VLDL", "LDLCALC", "LDLDIRECT"  Physical Exam:    VS:  BP 126/80 (BP Location: Right Arm, Patient Position: Sitting)   Pulse 60   Ht 6\' 1"  (1.854 m)   Wt 225 lb 12.8 oz (102.4 kg)   SpO2 96%   BMI 29.79 kg/m     Wt  Readings from Last 3 Encounters:  08/15/23 225 lb 12.8 oz (102.4 kg)  02/13/23 222 lb 9.6 oz (101 kg)  08/05/22 221 lb (100.2 kg)     GEN:  Well nourished, well developed in no acute distress HEENT: Normal NECK: No JVD; No carotid bruits LYMPHATICS: No lymphadenopathy CARDIAC: RRR, no murmurs, no rubs, no gallops RESPIRATORY:  Clear to auscultation without rales, wheezing or rhonchi  ABDOMEN: Soft, non-tender, non-distended MUSCULOSKELETAL:  No edema; No deformity  SKIN: Warm and dry LOWER EXTREMITIES: no swelling NEUROLOGIC:  Alert and oriented x 3 PSYCHIATRIC:  Normal affect   ASSESSMENT:    1. Primary hypertension   2. SVT (supraventricular tachycardia) (HCC)   3. Nonsustained ventricular tachycardia (HCC)   4. Dyslipidemia    PLAN:    In order of problems listed above:  Essential hypertension blood pressure well-controlled we will continue present management. SVT status post ablation asymptomatic doing well continue present management. History of nonsustained ventricular tachycardia no dizziness no palpitations stable overall workup negative we tried beta-blocker unable to tolerate it.  Next visit will repeat echocardiogram to recheck left ventricle ejection fraction.   Medication Adjustments/Labs and Tests Ordered: Current medicines are reviewed at length with the patient today.  Concerns regarding medicines are outlined above.  Orders Placed This Encounter  Procedures   EKG 12-Lead   Medication changes: No orders of the defined types were placed in this encounter.   Signed, Manfred Seed, MD, [facility] 08/15/2023 12:06 PM    [facility]

## 2023-08-15 NOTE — Patient Instructions (Signed)

## 2024-01-24 ENCOUNTER — Other Ambulatory Visit (HOSPITAL_BASED_OUTPATIENT_CLINIC_OR_DEPARTMENT_OTHER): Payer: Self-pay

## 2024-01-24 MED ORDER — TAMSULOSIN HCL 0.4 MG PO CAPS
0.4000 mg | ORAL_CAPSULE | Freq: Every evening | ORAL | 2 refills | Status: AC
  Filled 2024-01-24: qty 90, 90d supply, fill #0

## 2024-01-24 MED ORDER — BENAZEPRIL HCL 5 MG PO TABS
5.0000 mg | ORAL_TABLET | Freq: Every day | ORAL | 2 refills | Status: AC
  Filled 2024-01-24: qty 90, 90d supply, fill #0
  Filled 2024-04-27: qty 90, 90d supply, fill #1

## 2024-01-24 MED ORDER — BUPROPION HCL ER (XL) 150 MG PO TB24
150.0000 mg | ORAL_TABLET | Freq: Every day | ORAL | 2 refills | Status: AC
  Filled 2024-01-24: qty 90, 90d supply, fill #0
  Filled 2024-04-27: qty 90, 90d supply, fill #1

## 2024-01-24 MED ORDER — ATORVASTATIN CALCIUM 20 MG PO TABS
20.0000 mg | ORAL_TABLET | Freq: Every day | ORAL | 2 refills | Status: AC
  Filled 2024-01-24: qty 90, 90d supply, fill #0
  Filled 2024-04-27: qty 90, 90d supply, fill #1

## 2024-01-25 ENCOUNTER — Other Ambulatory Visit (HOSPITAL_BASED_OUTPATIENT_CLINIC_OR_DEPARTMENT_OTHER): Payer: Self-pay

## 2024-02-19 ENCOUNTER — Encounter: Payer: Self-pay | Admitting: Cardiology

## 2024-02-19 ENCOUNTER — Ambulatory Visit: Attending: Cardiology | Admitting: Cardiology

## 2024-02-19 ENCOUNTER — Other Ambulatory Visit: Payer: Self-pay | Admitting: *Deleted

## 2024-02-19 VITALS — BP 140/76 | HR 64 | Ht 73.0 in | Wt 228.0 lb

## 2024-02-19 DIAGNOSIS — I10 Essential (primary) hypertension: Secondary | ICD-10-CM | POA: Diagnosis not present

## 2024-02-19 DIAGNOSIS — E785 Hyperlipidemia, unspecified: Secondary | ICD-10-CM

## 2024-02-19 DIAGNOSIS — I4729 Other ventricular tachycardia: Secondary | ICD-10-CM | POA: Diagnosis not present

## 2024-02-19 DIAGNOSIS — I471 Supraventricular tachycardia, unspecified: Secondary | ICD-10-CM | POA: Diagnosis not present

## 2024-02-19 NOTE — Patient Instructions (Signed)
 Medication Instructions:  Your physician recommends that you continue on your current medications as directed. Please refer to the Current Medication list given to you today.  *If you need a refill on your cardiac medications before your next appointment, please call your pharmacy*   Lab Work: None ordered If you have labs (blood work) drawn today and your tests are completely normal, you will receive your results only by: MyChart Message (if you have MyChart) OR A paper copy in the mail If you have any lab test that is abnormal or we need to change your treatment, we will call you to review the results.  Testing/Procedures: Your physician has requested that you have an echocardiogram. Echocardiography is a painless test that uses sound waves to create images of your heart. It provides your doctor with information about the size and shape of your heart and how well your heart's chambers and valves are working. This procedure takes approximately one hour. There are no restrictions for this procedure. Please do NOT wear cologne, perfume, aftershave, or lotions (deodorant is allowed). Please arrive 15 minutes prior to your appointment time.  Please note: We ask at that you not bring children with you during ultrasound (echo/ vascular) testing. Due to room size and safety concerns, children are not allowed in the ultrasound rooms during exams. Our front office staff cannot provide observation of children in our lobby area while testing is being conducted. An adult accompanying a patient to their appointment will only be allowed in the ultrasound room at the discretion of the ultrasound technician under special circumstances. We apologize for any inconvenience.  Follow-Up: At [facility], you and your health needs are our priority.  As part of our continuing mission to provide you with exceptional heart care, we have created designated Provider Care Teams.  These Care Teams include your primary  Cardiologist (physician) and Advanced Practice Providers (APPs -  Physician Assistants and Nurse Practitioners) who all work together to provide you with the care you need, when you need it.  We recommend signing up for the patient portal called MyChart.  Sign up information is provided on this After Visit Summary.  MyChart is used to connect with patients for Virtual Visits (Telemedicine).  Patients are able to view lab/test results, encounter notes, upcoming appointments, etc.  Non-urgent messages can be sent to your provider as well.   To learn more about what you can do with MyChart, go to [email or web address].    Your next appointment:   12 month(s)  The format for your next appointment:   In Person  Provider:   Lamar Fitch, MD   Other Instructions Echocardiogram An echocardiogram is a test that uses sound waves (ultrasound) to produce images of the heart. Images from an echocardiogram can provide important information about: Heart size and shape. The size and thickness and movement of your heart's walls. Heart muscle function and strength. Heart valve function or if you have stenosis. Stenosis is when the heart valves are too narrow. If blood is flowing backward through the heart valves (regurgitation). A tumor or infectious growth around the heart valves. Areas of heart muscle that are not working well because of poor blood flow or injury from a heart attack. Aneurysm detection. An aneurysm is a weak or damaged part of an artery wall. The wall bulges out from the normal force of blood pumping through the body. Tell a health care provider about: Any allergies you have. All medicines you are taking, including vitamins, herbs,  eye drops, creams, and over-the-counter medicines. Any blood disorders you have. Any surgeries you have had. Any medical conditions you have. Whether you are pregnant or may be pregnant. What are the risks? Generally, this is a safe test.  However, problems may occur, including an allergic reaction to dye (contrast) that may be used during the test. What happens before the test? No specific preparation is needed. You may eat and drink normally. What happens during the test? You will take off your clothes from the waist up and put on a hospital gown. Electrodes or electrocardiogram (ECG)patches may be placed on your chest. The electrodes or patches are then connected to a device that monitors your heart rate and rhythm. You will lie down on a table for an ultrasound exam. A gel will be applied to your chest to help sound waves pass through your skin. A handheld device, called a transducer, will be pressed against your chest and moved over your heart. The transducer produces sound waves that travel to your heart and bounce back (or echo back) to the transducer. These sound waves will be captured in real-time and changed into images of your heart that can be viewed on a video monitor. The images will be recorded on a computer and reviewed by your health care provider. You may be asked to change positions or hold your breath for a short time. This makes it easier to get different views or better views of your heart. In some cases, you may receive contrast through an IV in one of your veins. This can improve the quality of the pictures from your heart. The procedure may vary among health care providers and hospitals.   What can I expect after the test? You may return to your normal, everyday life, including diet, activities, and medicines, unless your health care provider tells you not to do that. Follow these instructions at home: It is up to you to get the results of your test. Ask your health care provider, or the department that is doing the test, when your results will be ready. Keep all follow-up visits. This is important. Summary An echocardiogram is a test that uses sound waves (ultrasound) to produce images of the heart. Images  from an echocardiogram can provide important information about the size and shape of your heart, heart muscle function, heart valve function, and other possible heart problems. You do not need to do anything to prepare before this test. You may eat and drink normally. After the echocardiogram is completed, you may return to your normal, everyday life, unless your health care provider tells you not to do that. This information is not intended to replace advice given to you by your health care provider. Make sure you discuss any questions you have with your health care provider. Document Revised: 12/03/2019 Document Reviewed: 12/03/2019 Elsevier Patient Education  2021 Elsevier Inc.   Important Information About Sugar

## 2024-02-19 NOTE — Progress Notes (Signed)
 Cardiology Office Note:    Date:  02/19/2024   ID:  Austin Drake, DOB [birth year], MRN [id number]  PCP:  Austin Dene [name], DO  Cardiologist:  Lamar Fitch, MD    Referring MD: Austin Dene [name], DO   Chief Complaint  Patient presents with   Follow-up  Doing fine  History of Present Illness:    Austin Drake is a 73 y.o. male past medical history significant for supraventricular tachycardia, status post SVT ablation done more than 25 years ago, ventricular ectopy, ventricular tachycardia, essential hypertension, dyslipidemia, normal ejection fraction.  Comes today to months for follow-up overall doing great.  Exercise on the regular basis 3 times a week and then the days that he does not exercise he plays golf he walks all the time.  Denies have any chest pain tightness squeezing pressure burning chest no palpitation no dizziness no passing out  Past Medical History:  Diagnosis Date   Acquired plantar porokeratosis 06/23/2020   Depression 11/21/2017   Dyslipidemia 11/21/2017   Hypertension 11/21/2017   Knee joint pain 07/26/2012   Nonsustained ventricular tachycardia (HCC) 01/03/2018   SVT (supraventricular tachycardia) 11/21/2017   Status post ablation in 2003 done by Dr. Norman    Past Surgical History:  Procedure Laterality Date   COLONOSCOPY  2001   COLONOSCOPY  2009   COLONOSCOPY  2014   COLONOSCOPY  2019   COLONOSCOPY  2024   HERNIA REPAIR     HERNIA REPAIR  2019   KIDNEY STONE SURGERY  2002   LASIK  2002   LITHOTRIPSY  2007    Current Medications: Current Meds  Medication Sig   aspirin EC 81 MG tablet Take 1 tablet by mouth daily.   atorvastatin (LIPITOR) 20 MG tablet Take 1 tablet (20 mg total) by mouth at bedtime for high cholesterol   benazepril (LOTENSIN) 5 MG tablet Take 1 tablet (5 mg total) by mouth daily.   buPROPion (WELLBUTRIN XL) 150 MG 24 hr tablet Take 1 tablet (150 mg total) by mouth daily.   Cyanocobalamin 1000 MCG TBCR Take 1 tablet by  mouth 2 (two) times daily.    tamsulosin (FLOMAX) 0.4 MG CAPS capsule Take 1 capsule (0.4 mg total) by mouth at bedtime.     Allergies:   Patient has no known allergies.   Social History   Socioeconomic History   Marital status: Married    Spouse name: Not on file   Number of children: Not on file   Years of education: Not on file   Highest education level: Not on file  Occupational History   Not on file  Tobacco Use   Smoking status: Former   Smokeless tobacco: Never  Substance and Sexual Activity   Alcohol use: Not on file   Drug use: Not on file   Sexual activity: Not on file  Other Topics Concern   Not on file  Social History Narrative   Not on file   Social Drivers of Health   Financial Resource Strain: Not on file  Food Insecurity: Not on file  Transportation Needs: Not on file  Physical Activity: Not on file  Stress: Not on file  Social Connections: Not on file     Family History: The patient's family history includes Diabetes in his father; Heart disease in his father. ROS:   Please see the history of present illness.    All 14 point review of systems negative except as described per history of present illness  EKGs/Labs/Other Studies Reviewed:         Recent Labs: No results found for requested labs within last 365 days.  Recent Lipid Panel No results found for: CHOL, TRIG, HDL, CHOLHDL, VLDL, LDLCALC, LDLDIRECT  Physical Exam:    VS:  BP (!) 140/76   Pulse 64   Ht 6' 1 (1.854 m)   Wt 228 lb (103.4 kg)   SpO2 97%   BMI 30.08 kg/m     Wt Readings from Last 3 Encounters:  02/19/24 228 lb (103.4 kg)  08/15/23 225 lb 12.8 oz (102.4 kg)  02/13/23 222 lb 9.6 oz (101 kg)     GEN:  Well nourished, well developed in no acute distress HEENT: Normal NECK: No JVD; No carotid bruits LYMPHATICS: No lymphadenopathy CARDIAC: RRR, no murmurs, no rubs, no gallops RESPIRATORY:  Clear to auscultation without rales, wheezing or rhonchi   ABDOMEN: Soft, non-tender, non-distended MUSCULOSKELETAL:  No edema; No deformity  SKIN: Warm and dry LOWER EXTREMITIES: no swelling NEUROLOGIC:  Alert and oriented x 3 PSYCHIATRIC:  Normal affect   ASSESSMENT:    1. Primary hypertension   2. SVT (supraventricular tachycardia)   3. Nonsustained ventricular tachycardia (HCC)   4. Dyslipidemia    PLAN:    In order of problems listed above:  Essential hypertension blood pressure well-controlled continue present management. SVT denies having any recurrences. Nonsustained ventricular tachycardia, denies having dizziness or passing out.  Will recheck left ventricle ejection fraction.  I will make sure he does not have PVC induced cardiomyopathy. Dyslipidemia I did review KPN which show me LDL of 64 HDL 56 we will continue present medication which is moderate from statin form of Lipitor 20.   Medication Adjustments/Labs and Tests Ordered: Current medicines are reviewed at length with the patient today.  Concerns regarding medicines are outlined above.  No orders of the defined types were placed in this encounter.  Medication changes: No orders of the defined types were placed in this encounter.   Signed, Lamar DOROTHA Fitch, MD, [employer] 02/19/2024 1:46 PM    [facility]

## 2024-02-19 NOTE — Addendum Note (Signed)
 Addended by: ONEITA BERLINER on: 02/19/2024 02:17 PM   Modules accepted: Orders

## 2024-03-12 ENCOUNTER — Ambulatory Visit: Attending: Cardiology

## 2024-03-12 DIAGNOSIS — I4729 Other ventricular tachycardia: Secondary | ICD-10-CM

## 2024-03-12 LAB — ECHOCARDIOGRAM COMPLETE
Area-P 1/2: 3.19 cm2
S' Lateral: 2.7 cm

## 2024-03-13 ENCOUNTER — Other Ambulatory Visit (HOSPITAL_BASED_OUTPATIENT_CLINIC_OR_DEPARTMENT_OTHER): Payer: Self-pay

## 2024-03-13 MED ORDER — AMOXICILLIN 500 MG PO CAPS
1000.0000 mg | ORAL_CAPSULE | ORAL | 0 refills | Status: AC
  Filled 2024-03-13: qty 30, 10d supply, fill #0

## 2024-03-14 ENCOUNTER — Ambulatory Visit: Payer: Self-pay | Admitting: Cardiology

## 2024-04-27 ENCOUNTER — Other Ambulatory Visit (HOSPITAL_BASED_OUTPATIENT_CLINIC_OR_DEPARTMENT_OTHER): Payer: Self-pay

## 2024-04-29 ENCOUNTER — Other Ambulatory Visit: Payer: Self-pay

## 2024-04-29 ENCOUNTER — Other Ambulatory Visit (HOSPITAL_BASED_OUTPATIENT_CLINIC_OR_DEPARTMENT_OTHER): Payer: Self-pay

## 2024-05-02 ENCOUNTER — Other Ambulatory Visit (HOSPITAL_BASED_OUTPATIENT_CLINIC_OR_DEPARTMENT_OTHER): Payer: Self-pay

## 2024-05-02 MED ORDER — TAMSULOSIN HCL 0.4 MG PO CAPS
0.4000 mg | ORAL_CAPSULE | Freq: Every day | ORAL | 1 refills | Status: AC
  Filled 2024-05-02: qty 90, 90d supply, fill #0

## 2024-05-03 ENCOUNTER — Other Ambulatory Visit (HOSPITAL_BASED_OUTPATIENT_CLINIC_OR_DEPARTMENT_OTHER): Payer: Self-pay
